# Patient Record
Sex: Female | Born: 1955
Health system: Southern US, Community
[De-identification: ages and names within clinical notes are randomized; demographics above are authoritative.]

## PROBLEM LIST (undated history)

## (undated) DIAGNOSIS — E785 Hyperlipidemia, unspecified: Secondary | ICD-10-CM

## (undated) DIAGNOSIS — F411 Generalized anxiety disorder: Secondary | ICD-10-CM

## (undated) DIAGNOSIS — K219 Gastro-esophageal reflux disease without esophagitis: Secondary | ICD-10-CM

## (undated) DIAGNOSIS — T7840XA Allergy, unspecified, initial encounter: Secondary | ICD-10-CM

## (undated) HISTORY — PX: GANGLION CYST EXCISION: SHX1691

## (undated) HISTORY — DX: Generalized anxiety disorder: F41.1

## (undated) HISTORY — PX: BREAST SURGERY: SHX581

## (undated) HISTORY — DX: Allergy, unspecified, initial encounter: T78.40XA

## (undated) HISTORY — DX: Hyperlipidemia, unspecified: E78.5

## (undated) HISTORY — DX: Gastro-esophageal reflux disease without esophagitis: K21.9

---

## 1997-10-18 ENCOUNTER — Other Ambulatory Visit: Admission: RE | Admit: 1997-10-18 | Discharge: 1997-10-18 | Payer: Self-pay | Admitting: *Deleted

## 1999-01-25 ENCOUNTER — Other Ambulatory Visit: Admission: RE | Admit: 1999-01-25 | Discharge: 1999-01-25 | Payer: Self-pay | Admitting: Obstetrics and Gynecology

## 2000-04-08 ENCOUNTER — Other Ambulatory Visit: Admission: RE | Admit: 2000-04-08 | Discharge: 2000-04-08 | Payer: Self-pay | Admitting: *Deleted

## 2001-05-05 ENCOUNTER — Other Ambulatory Visit: Admission: RE | Admit: 2001-05-05 | Discharge: 2001-05-05 | Payer: Self-pay | Admitting: Obstetrics and Gynecology

## 2002-06-20 ENCOUNTER — Other Ambulatory Visit: Admission: RE | Admit: 2002-06-20 | Discharge: 2002-06-20 | Payer: Self-pay | Admitting: Obstetrics and Gynecology

## 2002-09-12 ENCOUNTER — Other Ambulatory Visit: Admission: RE | Admit: 2002-09-12 | Discharge: 2002-09-12 | Payer: Self-pay | Admitting: Family Medicine

## 2003-08-30 ENCOUNTER — Other Ambulatory Visit: Admission: RE | Admit: 2003-08-30 | Discharge: 2003-08-30 | Payer: Self-pay | Admitting: Obstetrics and Gynecology

## 2003-11-17 LAB — HM PAP SMEAR: HM Pap smear: NORMAL

## 2005-06-03 ENCOUNTER — Ambulatory Visit (HOSPITAL_COMMUNITY): Admission: RE | Admit: 2005-06-03 | Discharge: 2005-06-03 | Payer: Self-pay | Admitting: Obstetrics and Gynecology

## 2005-07-11 ENCOUNTER — Encounter: Admission: RE | Admit: 2005-07-11 | Discharge: 2005-07-11 | Payer: Self-pay | Admitting: Obstetrics and Gynecology

## 2006-07-09 ENCOUNTER — Ambulatory Visit (HOSPITAL_COMMUNITY): Admission: RE | Admit: 2006-07-09 | Discharge: 2006-07-09 | Payer: Self-pay | Admitting: Obstetrics and Gynecology

## 2007-07-12 ENCOUNTER — Ambulatory Visit (HOSPITAL_COMMUNITY): Admission: RE | Admit: 2007-07-12 | Discharge: 2007-07-12 | Payer: Self-pay | Admitting: Obstetrics and Gynecology

## 2010-06-17 ENCOUNTER — Ambulatory Visit (HOSPITAL_COMMUNITY)
Admission: RE | Admit: 2010-06-17 | Discharge: 2010-06-17 | Payer: Self-pay | Source: Home / Self Care | Admitting: Obstetrics and Gynecology

## 2012-12-17 ENCOUNTER — Other Ambulatory Visit (HOSPITAL_COMMUNITY): Payer: Self-pay | Admitting: Obstetrics and Gynecology

## 2012-12-17 DIAGNOSIS — Z1231 Encounter for screening mammogram for malignant neoplasm of breast: Secondary | ICD-10-CM

## 2012-12-24 ENCOUNTER — Ambulatory Visit (HOSPITAL_COMMUNITY)
Admission: RE | Admit: 2012-12-24 | Discharge: 2012-12-24 | Disposition: A | Discharge status: Home / Self Care | Payer: BC Managed Care – PPO | Source: Ambulatory Visit | Attending: Obstetrics and Gynecology | Admitting: Obstetrics and Gynecology

## 2012-12-24 DIAGNOSIS — Z1231 Encounter for screening mammogram for malignant neoplasm of breast: Secondary | ICD-10-CM | POA: Insufficient documentation

## 2013-01-04 ENCOUNTER — Other Ambulatory Visit: Payer: Self-pay | Admitting: Gynecology

## 2013-01-04 ENCOUNTER — Other Ambulatory Visit: Payer: Self-pay | Admitting: Obstetrics and Gynecology

## 2013-01-04 DIAGNOSIS — R928 Other abnormal and inconclusive findings on diagnostic imaging of breast: Secondary | ICD-10-CM

## 2013-01-12 ENCOUNTER — Other Ambulatory Visit: Payer: Self-pay | Admitting: Family Medicine

## 2013-01-12 DIAGNOSIS — R928 Other abnormal and inconclusive findings on diagnostic imaging of breast: Secondary | ICD-10-CM

## 2013-01-18 ENCOUNTER — Ambulatory Visit
Admission: RE | Admit: 2013-01-18 | Discharge: 2013-01-18 | Disposition: A | Discharge status: Home / Self Care | Payer: BC Managed Care – PPO | Source: Ambulatory Visit | Attending: Obstetrics and Gynecology | Admitting: Obstetrics and Gynecology

## 2013-01-18 DIAGNOSIS — R928 Other abnormal and inconclusive findings on diagnostic imaging of breast: Secondary | ICD-10-CM

## 2013-01-28 ENCOUNTER — Encounter: Payer: Self-pay | Admitting: Family Medicine

## 2013-02-09 ENCOUNTER — Ambulatory Visit (INDEPENDENT_AMBULATORY_CARE_PROVIDER_SITE_OTHER): Payer: BC Managed Care – PPO | Admitting: Physician Assistant

## 2013-02-09 ENCOUNTER — Encounter: Payer: Self-pay | Admitting: Physician Assistant

## 2013-02-09 VITALS — BP 132/80 | HR 60 | Temp 98.2°F | Resp 18 | Ht 65.5 in | Wt 212.0 lb

## 2013-02-09 DIAGNOSIS — Z1211 Encounter for screening for malignant neoplasm of colon: Secondary | ICD-10-CM

## 2013-02-09 DIAGNOSIS — Z0000 Encounter for general adult medical examination without abnormal findings: Secondary | ICD-10-CM

## 2013-02-09 DIAGNOSIS — F411 Generalized anxiety disorder: Secondary | ICD-10-CM

## 2013-02-09 HISTORY — DX: Generalized anxiety disorder: F41.1

## 2013-02-09 LAB — COMPLETE METABOLIC PANEL WITH GFR
ALT: 19 U/L (ref 0–35)
AST: 22 U/L (ref 0–37)
Albumin: 4.5 g/dL (ref 3.5–5.2)
Alkaline Phosphatase: 87 U/L (ref 39–117)
BUN: 13 mg/dL (ref 6–23)
CO2: 24 mEq/L (ref 19–32)
Calcium: 9.6 mg/dL (ref 8.4–10.5)
Chloride: 105 mEq/L (ref 96–112)
Creat: 0.83 mg/dL (ref 0.50–1.10)
GFR, Est African American: >89 mL/min
GFR, Est Non African American: 79 mL/min
Glucose, Bld: 106 mg/dL — ABNORMAL HIGH (ref 70–99)
Sodium: 140 mEq/L (ref 135–145)
Total Bilirubin: 0.4 mg/dL (ref 0.3–1.2)
Total Protein: 7.1 g/dL (ref 6.0–8.3)

## 2013-02-09 LAB — TSH: TSH: 0.837 u[IU]/mL (ref 0.350–4.500)

## 2013-02-09 LAB — CBC WITH DIFFERENTIAL/PLATELET
Basophils Relative: 1 % (ref 0–1)
Eosinophils Absolute: 0.1 10*3/uL (ref 0.0–0.7)
Eosinophils Relative: 2 % (ref 0–5)
HCT: 41.7 % (ref 36.0–46.0)
Hemoglobin: 14.2 g/dL (ref 12.0–15.0)
Lymphs Abs: 2.9 10*3/uL (ref 0.7–4.0)
MCH: 29.6 pg (ref 26.0–34.0)
MCHC: 34.1 g/dL (ref 30.0–36.0)
MCV: 87.1 fL (ref 78.0–100.0)
Monocytes Absolute: 0.4 10*3/uL (ref 0.1–1.0)
Monocytes Relative: 6 % (ref 3–12)
Neutro Abs: 2.3 10*3/uL (ref 1.7–7.7)
Neutrophils Relative %: 40 % — ABNORMAL LOW (ref 43–77)
Platelets: 398 10*3/uL (ref 150–400)
RBC: 4.79 MIL/uL (ref 3.87–5.11)
RDW: 12.1 % (ref 11.5–15.5)
WBC: 5.8 10*3/uL (ref 4.0–10.5)

## 2013-02-09 MED ORDER — CITALOPRAM HYDROBROMIDE 20 MG PO TABS: 20.0000 mg | ORAL_TABLET | Freq: Every day | ORAL | Status: DC

## 2013-02-09 MED ORDER — CLONAZEPAM 0.5 MG PO TABS: 0.5000 mg | ORAL_TABLET | Freq: Two times a day (BID) | ORAL | Status: DC | PRN

## 2013-02-09 NOTE — Progress Notes (Signed)
Patient ID: Kristi Sims MRN: [id number], DOB: 10-Oct-1955, 57 y.o. Date of Encounter: @DATE @  Chief Complaint:  Chief Complaint  Patient presents with  . Annual Exam    no pap    HPI: 57 y.o. year old white female  presents for CPE. Her LOV here was in 2011 and that was just for a abscess. Has never had CPE here-only OVs for specific problems. Also, she states she has had no medical care elsewhere since LOV here.   Reports feeling high stress and anxiety-both at work and at home. Works 10 hour shifts.Works 56 hour/week. At [employer]. (see social hx section for details).  Lives alone0has lived alone for long time. But, feels anxious all the time, even at home. There is no true high stress in her life comapred to usual.   Had some lab and BP check at work recently-brought those results with her.   No other c/o.   Past Medical History  Diagnosis Date  . Generalized anxiety disorder 02/09/2013     Home Meds: See attached medication section for current medication list. Any medications entered into computer today will not appear on this note's list. The medications listed below were entered prior to today. No current outpatient prescriptions on file prior to visit.   No current facility-administered medications on file prior to visit.    Allergies: No Known Allergies  History   Social History  . Marital Status: Single    Spouse Name: N/A    Number of Children: N/A  . Years of Education: N/A   Occupational History  . Factory Work    Social History Main Topics  . Smoking status: Former Smoker    Types: Cigarettes    Quit date: 11/10/2012  . Smokeless tobacco: Never Used  . Alcohol Use: No  . Drug Use: No  . Sexually Active: No   Other Topics Concern  . 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    "Tries to live [place] Life-Not Sexually  Active by Choice"    Family History  Problem Relation Age of Onset  . Cancer Mother     cervical s/p hyst  . Hypertension Father   . Diabetes Brother      Review of Systems:  See HPI for pertinent ROS. All other ROS negative.    Physical Exam: Blood pressure 132/80, pulse 60, temperature 98.2 F (36.8 C), temperature source Oral, resp. rate 18, height 5' 5.5" (1.664 m), weight 212 lb (96.163 kg)., Body mass index is 34.73 kg/(m^2). General: WNWD AAF. Appears in no acute distress. Head: Normocephalic, atraumatic, eyes without discharge, sclera non-icteric, nares are without discharge. Bilateral auditory canals obstructed by cerumen. Oral cavity moist, posterior pharynx without exudate, erythema, peritonsillar abscess, or post nasal drip. Neck: Supple. No thyromegaly. No lymphadenopathy.No carotid bruits. Lungs: Clear bilaterally to auscultation without wheezes, rales, or rhonchi. Breathing is unlabored. Heart: RRR with S1 S2. No murmurs, rubs, or gallops. Abdomen: Soft, non-tender, non-distended with normoactive bowel sounds. No hepatomegaly. No rebound/guarding. No obvious abdominal masses. Musculoskeletal:  Strength and tone normal for age. Extremities/Skin: Warm and dry. No edema. No rashes or suspicious lesions. Neuro: Alert and oriented X 3. Moves all extremities spontaneously. Gait is normal. CNII-XII grossly in tact. Psych:  Responds to questions appropriately with a normal affect.     ASSESSMENT  AND PLAN:  57 y.o. year old female with  1. Visit for preventive health examination  A.Screening Labs: Pt had some labs done through her employer. She brought that print out with her. 11/02/12: Glucose: 74  Chol:  210  Trig:   45  HDL:   87  LDL:   114 - CBC with Differential - COMPLETE METABOLIC PANEL WITH GFR - TSH - Vitamin D 25 hydroxy  B. Mammogram and Left Breast U/S: 01/18/2013-Normal.Repeat one year.  C. Breast Exam, Pelvic Exam/Pap: Cont annual eval with  Gyn  Colonoscopy: Has never had. Is agreeable to have. I will refer.  Immunizations: Had Tetanus at work in past 10 years.   No immuni needed now.   2. Generalized anxiety disorder Discussed proper use and expectation of each medication at length. Do not take Klonopin prior to driving, operating machinery, or going to work. If has adv effect to med, call me. Ow/ f/u OV 6 weeks.  Reviewed chart. I prescribed Celexa back in 209. It worked well for her and caused no adv effects so will use this again.  - citalopram (CELEXA) 20 MG tablet; Take 1 tablet (20 mg total) by mouth daily.  Dispense: 30 tablet; Refill: 3 - clonazePAM (KLONOPIN) 0.5 MG tablet; Take 1 tablet (0.5 mg total) by mouth 2 (two) times daily as needed for anxiety.  Dispense: 20 tablet; Refill: 1  3. Screening for colorectal cancer - Ambulatory referral to Gastroenterology  Cerumen Impaction, Bilaterally-offered for [place] to irrigate. She prefers to use otc drops.    ROV 6 weeks, sooner if needed.  Signed, [address] [place], [place], [facility] 56/30/2014 1:33 PM

## 2013-02-10 ENCOUNTER — Encounter: Payer: Self-pay | Admitting: Family Medicine

## 2013-04-06 ENCOUNTER — Encounter: Payer: Self-pay | Admitting: Family Medicine

## 2013-05-23 ENCOUNTER — Telehealth: Payer: Self-pay | Admitting: *Deleted

## 2013-05-23 DIAGNOSIS — F411 Generalized anxiety disorder: Secondary | ICD-10-CM

## 2013-05-23 MED ORDER — CLONAZEPAM 0.5 MG PO TABS: 0.5000 mg | ORAL_TABLET | Freq: Two times a day (BID) | ORAL | Status: DC | PRN

## 2013-05-23 MED ORDER — CITALOPRAM HYDROBROMIDE 20 MG PO TABS: 20.0000 mg | ORAL_TABLET | Freq: Every day | ORAL | Status: DC

## 2013-05-23 NOTE — Telephone Encounter (Signed)
ok 

## 2013-05-23 NOTE — Telephone Encounter (Signed)
?   OK to Refill  

## 2013-05-23 NOTE — Telephone Encounter (Signed)
Rx Refilled  

## 2013-06-01 ENCOUNTER — Other Ambulatory Visit: Payer: Self-pay | Admitting: Physician Assistant

## 2013-07-03 ENCOUNTER — Other Ambulatory Visit: Payer: Self-pay | Admitting: Physician Assistant

## 2013-08-18 ENCOUNTER — Other Ambulatory Visit (HOSPITAL_COMMUNITY): Payer: Self-pay | Admitting: Obstetrics and Gynecology

## 2013-08-18 DIAGNOSIS — N951 Menopausal and female climacteric states: Secondary | ICD-10-CM

## 2013-09-08 ENCOUNTER — Other Ambulatory Visit: Payer: Self-pay | Admitting: Family Medicine

## 2013-09-09 ENCOUNTER — Encounter: Payer: Self-pay | Admitting: Family Medicine

## 2013-09-09 NOTE — Telephone Encounter (Signed)
Will send letter to schedule ov

## 2013-09-26 ENCOUNTER — Other Ambulatory Visit: Payer: Self-pay | Admitting: Family Medicine

## 2013-09-26 DIAGNOSIS — F419 Anxiety disorder, unspecified: Secondary | ICD-10-CM

## 2013-09-27 ENCOUNTER — Encounter: Payer: Self-pay | Admitting: Family Medicine

## 2013-09-27 NOTE — Telephone Encounter (Signed)
Last RF 05/23/13  #20 +1.  Last ov 02/10/16.  Was to return in 6 weeks !!  NTBS.  Letter sent to schedule appt. Refill denied

## 2013-10-13 ENCOUNTER — Ambulatory Visit (INDEPENDENT_AMBULATORY_CARE_PROVIDER_SITE_OTHER): Payer: BC Managed Care – PPO | Admitting: Physician Assistant

## 2013-10-13 ENCOUNTER — Ambulatory Visit: Payer: BC Managed Care – PPO | Admitting: Physician Assistant

## 2013-10-13 ENCOUNTER — Encounter: Payer: Self-pay | Admitting: Physician Assistant

## 2013-10-13 VITALS — BP 130/80 | HR 68 | Temp 98.1°F | Resp 18 | Ht 64.5 in | Wt 217.0 lb

## 2013-10-13 DIAGNOSIS — H6120 Impacted cerumen, unspecified ear: Secondary | ICD-10-CM

## 2013-10-13 DIAGNOSIS — Z1212 Encounter for screening for malignant neoplasm of rectum: Principal | ICD-10-CM

## 2013-10-13 DIAGNOSIS — Z1211 Encounter for screening for malignant neoplasm of colon: Secondary | ICD-10-CM

## 2013-10-13 DIAGNOSIS — E669 Obesity, unspecified: Secondary | ICD-10-CM

## 2013-10-13 DIAGNOSIS — F411 Generalized anxiety disorder: Secondary | ICD-10-CM

## 2013-10-13 MED ORDER — CITALOPRAM HYDROBROMIDE 20 MG PO TABS: 20.0000 mg | ORAL_TABLET | Freq: Every day | ORAL | Status: DC

## 2013-10-13 MED ORDER — CLONAZEPAM 0.5 MG PO TABS: 0.5000 mg | ORAL_TABLET | Freq: Two times a day (BID) | ORAL | Status: DC | PRN

## 2013-10-13 NOTE — Progress Notes (Signed)
Patient ID: Kristi Sims MRN: [id number], DOB: 1955/09/01, 58 y.o. Date of Encounter: @DATE @  Chief Complaint:  Chief Complaint  Patient presents with  . 6 month check up    left ear is bothering her, not fasting, med refills    HPI: 58 y.o. year old female  presents for routine followup office visit.  Saw her for complete physical exam on 02/09/13.  At that visit 02/09/13 she also reported feeling high stress and anxiety--both at work and at home. Reported that she works 10 hours shifts. Works 56 hours per week. Works at [employer]. She lives alone and has lived alone for a long time. However she reported that she felt anxious all the time even at home. She reported that there was no true high stress in her life compared to usual at that time but still would feel anxious and tense all the time.  At that visit I reviewed that I had prescribed her Celexa back in 2009. It worked well for her and caused no adverse effects back then, so I  prescribed this again 01/2013.  Today she reports that she is taking the Celexa 20 mg daily. Says that it is working great. Says that she does not feel nearly as anxious and tense all the time. Says that in general she " just feels better."         Says some nights she uses the Klonopin but does not take it every night. Rarely needs her Klonopin during the day.  I noted that I had referred her for colonoscopy at her last visit but that this has not been done. She reports that she was having problems with her car and did not have transportation at that time. She says that she now does have her car and does have transportation available and is agreeable to go for this.    Past Medical History  Diagnosis Date  . Generalized anxiety disorder 02/09/2013     Home Meds: See attached medication section for current medication list. Any medications entered into computer today will not appear on this note's list. The medications listed below were entered prior  to today. No current outpatient prescriptions on file prior to visit.   No current facility-administered medications on file prior to visit.    Allergies: No Known Allergies  History   Social History  . Marital Status: Single    Spouse Name: N/A    Number of Children: N/A  . Years of Education: N/A   Occupational History  . Factory Work    Social History Main Topics  . Smoking status: Former Smoker    Types: Cigarettes    Quit date: 11/10/2012  . Smokeless tobacco: Never Used  . Alcohol Use: No  . Drug Use: No  . Sexual Activity: No   Other Topics Concern  . 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    "Tries to live [place] Sexually Active by Choice"    Family History  Problem Relation Age of Onset  . Cancer Mother     cervical s/p hyst  . Hypertension Father   . Diabetes Brother      Review of Systems:  See HPI for pertinent ROS. All other ROS negative.    Physical Exam: Blood pressure 130/80, pulse 68, temperature 98.1 F (  36.7 C), temperature source Oral, resp. rate 18, height 5' 4.5" (1.638 m), weight 217 lb (98.431 kg)., Body mass index is 36.69 kg/(m^2). General: Obese Female. Appears in no acute distress. Head: Bilateral ear canals are completely obstructed with cerumen. Neck: Supple. No thyromegaly. No lymphadenopathy. No carotid bruits. Lungs: Clear bilaterally to auscultation without wheezes, rales, or rhonchi. Breathing is unlabored. Heart: RRR with S1 S2. No murmurs, rubs, or gallops. Abdomen: Soft, non-tender, non-distended with normoactive bowel sounds. No hepatomegaly. No rebound/guarding. No obvious abdominal masses. Musculoskeletal:  Strength and tone normal for age. Extremities/Skin: Warm and dry. No clubbing or cyanosis. No edema. No rashes or suspicious lesions. Neuro: Alert and oriented X 3. Moves all  extremities spontaneously. Gait is normal. CNII-XII grossly in tact. Psych:  Responds to questions appropriately with a normal affect.     ASSESSMENT AND PLAN:  58 y.o. year old female with  1. Generalized anxiety disorder - clonazePAM (KLONOPIN) 0.5 MG tablet; Take 1 tablet (0.5 mg total) by mouth 2 (two) times daily as needed for anxiety.  Dispense: 30 tablet; Refill: 1 - citalopram (CELEXA) 20 MG tablet; Take 1 tablet (20 mg total) by mouth daily.  Dispense: 30 tablet; Refill: 5  2. Screening for colorectal cancer - Ambulatory referral to Gastroenterology  3. Cerumen impaction He had bilateral cerumen impaction at her visit 02/09/13. At that time she opted to use over-the-counter drops. She says that she has been using these. However still with impaction so we will irrigate her ears today.  FOR PREVENTIVE CARE SEE CPE NOTE 02/09/2013. ALSO, UPDATE INFO GIVEN TO SABRINA TODAY TO ABSTRACT FOR CINA    Signed, [address] [place], [place], [facility] 10/13/2013 10:12 AM

## 2013-11-14 ENCOUNTER — Encounter: Payer: Self-pay | Admitting: Physician Assistant

## 2014-03-01 ENCOUNTER — Other Ambulatory Visit: Payer: Self-pay | Admitting: Physician Assistant

## 2014-03-02 NOTE — Telephone Encounter (Signed)
Ok to refill??  Last office visit/ refill 10/13/2013, #1 refill.

## 2014-03-02 NOTE — Telephone Encounter (Signed)
ok 

## 2014-03-02 NOTE — Telephone Encounter (Signed)
Medication called to pharmacy. 

## 2014-04-14 ENCOUNTER — Telehealth: Payer: Self-pay | Admitting: Family Medicine

## 2014-04-14 ENCOUNTER — Encounter: Payer: Self-pay | Admitting: Family Medicine

## 2014-04-14 DIAGNOSIS — F411 Generalized anxiety disorder: Secondary | ICD-10-CM

## 2014-04-14 MED ORDER — CITALOPRAM HYDROBROMIDE 20 MG PO TABS: 20.0000 mg | ORAL_TABLET | Freq: Every day | ORAL | Status: DC

## 2014-04-14 NOTE — Telephone Encounter (Signed)
Medication refill for one time only.  Patient needs to be seen.  Letter sent for patient to call and schedule 

## 2014-05-13 ENCOUNTER — Other Ambulatory Visit: Payer: Self-pay | Admitting: Physician Assistant

## 2014-05-15 ENCOUNTER — Ambulatory Visit: Payer: BC Managed Care – PPO | Admitting: Physician Assistant

## 2014-05-15 NOTE — Telephone Encounter (Signed)
Medication refilled per protocol. 

## 2014-05-31 ENCOUNTER — Ambulatory Visit (INDEPENDENT_AMBULATORY_CARE_PROVIDER_SITE_OTHER): Payer: BC Managed Care – PPO | Admitting: Physician Assistant

## 2014-05-31 ENCOUNTER — Encounter: Payer: Self-pay | Admitting: Physician Assistant

## 2014-05-31 VITALS — BP 146/84 | HR 56 | Temp 98.2°F | Resp 18 | Wt 212.0 lb

## 2014-05-31 DIAGNOSIS — F411 Generalized anxiety disorder: Secondary | ICD-10-CM

## 2014-05-31 DIAGNOSIS — E669 Obesity, unspecified: Secondary | ICD-10-CM

## 2014-05-31 DIAGNOSIS — H6123 Impacted cerumen, bilateral: Secondary | ICD-10-CM

## 2014-05-31 DIAGNOSIS — Z1212 Encounter for screening for malignant neoplasm of rectum: Secondary | ICD-10-CM

## 2014-05-31 DIAGNOSIS — Z1211 Encounter for screening for malignant neoplasm of colon: Secondary | ICD-10-CM

## 2014-05-31 MED ORDER — CLONAZEPAM 0.5 MG PO TABS: ORAL_TABLET | ORAL | Status: DC

## 2014-05-31 MED ORDER — CITALOPRAM HYDROBROMIDE 20 MG PO TABS: ORAL_TABLET | ORAL | Status: DC

## 2014-05-31 NOTE — Progress Notes (Signed)
Patient ID: Kristi Sims MRN: [id number], DOB: 1955-08-07, 58 y.o. Date of Encounter: @DATE @  Chief Complaint:  Chief Complaint  Patient presents with  . 6 month check up    not fasting, left ear ache    HPI: 58 y.o. year old female  presents for routine followup office visit.  Saw her for complete physical exam on 02/09/13.  At that visit 02/09/13 she also reported feeling high stress and anxiety--both at work and at home. Reported that she works 10 hours shifts. Works 56 hours per week. Works at [employer]. She lives alone and has lived alone for a long time. However she reported that she felt anxious all the time even at home. She reported that there was no true high stress in her life compared to usual at that time but still would feel anxious and tense all the time.  At that visit I reviewed that I had prescribed her Celexa back in 2009. It worked well for her and caused no adverse effects back then, so I  prescribed this again 01/2013.  At [place] 10/2013 she reported that she was taking the Celexa 20 mg daily. Michela Pitcher that it is working great. Said that she does not feel nearly as anxious and tense all the time. Said that in general she " just feels better."  Said some nights she uses the Klonopin but does not take it every night. Rarely needs her Klonopin during the day.  Today--05/2014--she says that the Celexa is still working very well. Says that she does not feel stressed out. Says that she can walk more and therefore make more money. Says that she rarely uses Klonopin during the day. Says that she uses that some nights but only about 1 night per week on average.  Says that business has been "picked up "and so she's been working 7 days per week because she needed the money. Says because of this work schedule she has not gone for colonoscopy which was discussed at the last couple of visits.  She feels good and has no complaints today.   Past Medical History  Diagnosis Date  .  Generalized anxiety disorder 02/09/2013     Home Meds: . Current Outpatient Prescriptions on File Prior to Visit  Medication Sig Dispense Refill  . citalopram (CELEXA) 20 MG tablet TAKE 1 TABLET EVERY DAY....NEED OFFICE VISIT BEFORE ANY FUTHER REFILLS 30 tablet 0  . clonazePAM (KLONOPIN) 0.5 MG tablet TAKE 1 TABLET BY MOUTH 2 TIMES DAILY AS NEEDED FOR ANXIETY 30 tablet 1   No current facility-administered medications on file prior to visit.    Allergies: No Known Allergies  History   Social History  . Marital Status: Single    Spouse Name: N/A    Number of Children: N/A  . Years of Education: N/A   Occupational History  . Factory Work    Social History Main Topics  . Smoking status: Former Smoker    Types: Cigarettes    Quit date: 11/10/2012  . Smokeless tobacco: Never Used  . Alcohol Use: No  . Drug Use: No  . Sexual Activity: No   Other Topics Concern  . 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    "Tries to live [place] Sexually Active by Choice"    Family History  Problem  Relation Age of Onset  . Cancer Mother     cervical s/p hyst  . Hypertension Father   . Diabetes Brother      Review of Systems:  See HPI for pertinent ROS. All other ROS negative.    Physical Exam: Blood pressure 146/84, pulse 56, temperature 98.2 F (36.8 C), temperature source Oral, resp. rate 18, weight 212 lb (96.163 kg)., Body mass index is 35.84 kg/(m^2). General: Obese Female. Appears in no acute distress. Head: Bilateral ear canals are completely obstructed with cerumen. Neck: Supple. No thyromegaly. No lymphadenopathy. No carotid bruits. Lungs: Clear bilaterally to auscultation without wheezes, rales, or rhonchi. Breathing is unlabored. Heart: RRR with S1 S2. No murmurs, rubs, or gallops. Abdomen: Soft, non-tender, non-distended with  normoactive bowel sounds. No hepatomegaly. No rebound/guarding. No obvious abdominal masses. Musculoskeletal:  Strength and tone normal for age. Extremities/Skin: Warm and dry.  No edema.  Neuro: Alert and oriented X 3. Moves all extremities spontaneously. Gait is normal. CNII-XII grossly in tact. Psych:  Responds to questions appropriately with a normal affect.     ASSESSMENT AND PLAN:  58 y.o. year old female with   1. Generalized anxiety disorder - citalopram (CELEXA) 20 MG tablet; TAKE 1 TABLET EVERY DAY....NEED OFFICE VISIT BEFORE ANY FUTHER REFILLS  Dispense: 90 tablet; Refill: 1 - clonazePAM (KLONOPIN) 0.5 MG tablet; TAKE 1 TABLET BY MOUTH 2 TIMES DAILY AS NEEDED FOR ANXIETY  Dispense: 30 tablet; Refill: 1  2. Screening for colorectal cancer See HPI--she will call me when she can take a day off work.  3. Obesity   4. Cerumen impaction, bilateral Irrigated both of her ears at her last visit 10/13/13. She wants [place] to go ahead and irrigate both ears again today.--she has minimal cerumen only around the periphery of the canal on the right. However she does have complete occlusion of the canal with cerumen on the left. At her visit 02/09/13 she had cerumen bilaterally and at that visit she had opted to use over-the-counter drops but when she came in for follow-up those had not worked.    FOR PREVENTIVE CARE SEE THE FOLLOWING, COPIED FROM CPE NOTE 02/09/2013:  THE FOLLOWING IS COPIED FROM CPE NOTE 02/09/2013:  1. Visit for preventive health examination  A.Screening Labs: Pt had some labs done through her employer. She brought that print out with her. 11/02/12: Glucose: 74 Chol: 210 Trig: 45 HDL: 87 LDL: 114 - CBC with Differential - COMPLETE METABOLIC PANEL WITH GFR - TSH - Vitamin D 25 hydroxy  B. Mammogram and Left Breast U/S: 01/18/2013-Normal.Repeat one year.  C. Breast Exam, Pelvic Exam/Pap: Cont annual eval with  Gyn  Colonoscopy: Has never had. Is agreeable to have. I will refer.  Immunizations: Had Tetanus at work in past 10 years.  No immuni needed now.   Signed, [address] [place], [place], [facility] 05/31/2014 8:14 AM

## 2014-10-30 ENCOUNTER — Other Ambulatory Visit: Payer: Self-pay | Admitting: Physician Assistant

## 2014-10-31 NOTE — Telephone Encounter (Signed)
rx called in

## 2014-10-31 NOTE — Telephone Encounter (Signed)
ok 

## 2014-10-31 NOTE — Telephone Encounter (Signed)
Ok to refill??  Last office visit/ refill 05/31/2014, #1 refill.

## 2014-12-14 ENCOUNTER — Other Ambulatory Visit: Payer: Self-pay | Admitting: Physician Assistant

## 2015-03-30 ENCOUNTER — Other Ambulatory Visit: Payer: Self-pay | Admitting: Family Medicine

## 2015-04-02 NOTE — Telephone Encounter (Signed)
Medication called to pharmacy. 

## 2015-04-02 NOTE — Telephone Encounter (Signed)
ok 

## 2015-04-02 NOTE — Telephone Encounter (Signed)
Ok to refill??  Last office visit 05/31/2014.  Last refill 10/31/2014, #1 refill.

## 2015-06-28 ENCOUNTER — Encounter: Payer: Self-pay | Admitting: Family Medicine

## 2015-06-28 ENCOUNTER — Other Ambulatory Visit: Payer: Self-pay | Admitting: Physician Assistant

## 2015-06-28 NOTE — Telephone Encounter (Signed)
Medication refill for one time only.  Patient needs to be seen.  Letter sent for patient to call and schedule.  Pt has not been seen in over 1 year. 

## 2015-08-08 ENCOUNTER — Other Ambulatory Visit: Payer: Self-pay | Admitting: Physician Assistant

## 2015-08-30 ENCOUNTER — Encounter: Payer: Self-pay | Admitting: Podiatry

## 2015-08-30 ENCOUNTER — Ambulatory Visit (INDEPENDENT_AMBULATORY_CARE_PROVIDER_SITE_OTHER): Payer: BLUE CROSS/BLUE SHIELD | Admitting: Podiatry

## 2015-08-30 VITALS — BP 123/70 | HR 62 | Resp 12

## 2015-08-30 DIAGNOSIS — L600 Ingrowing nail: Secondary | ICD-10-CM | POA: Diagnosis not present

## 2015-08-30 NOTE — Progress Notes (Signed)
Subjective:     Patient ID: KHRYSTEN Sims, female   DOB: 09/16/1955, 60 y.o.   MRN: [id number]  HPI patient states this left big toenail is bothering me more and more and I know I need to have it removed as I had a removed temporarily before and it seems to be bothering me increasingly   Review of Systems  All other systems reviewed and are negative.      Objective:   Physical Exam  Constitutional: She is oriented to person, place, and time.  Cardiovascular: Intact distal pulses.   Musculoskeletal: Normal range of motion.  Neurological: She is oriented to person, place, and time.  Skin: Skin is warm.  Nursing note and vitals reviewed.  neurovascular status intact muscle strength adequate range of motion within normal limits with patient found to have a thickened damaged left hallux nail that's loose and painful when pressed from a dorsal direction. Good digital perfusion and well oriented 3     Assessment:     Damaged left hallux nail with deformity of the bed and pain    Plan:     H&P condition reviewed and reviewed treatment options. Due to long-standing nature I recommended nail removal and a permanent fashion patient wanting procedure understanding risk. Today I infiltrated the left hallux 60 mg like Marcaine mixture remove the hallux nail exposed matrix and applied phenol 5 applications 30 seconds followed by alcohol lavage and sterile dressing. Gave instructions on soaks and reappoint

## 2015-08-30 NOTE — Patient Instructions (Signed)

## 2015-08-30 NOTE — Progress Notes (Signed)
   Subjective:    Patient ID: Kristi Sims, female    DOB: 1955-11-18, 60 y.o.   MRN: [id number]  HPI  Lt foot great toenail is sore/discoloration/split in the middle for 2 months. Toenail is getting worse especially when putting pressure. Tried trim it down-no treatment.  Review of Systems  Skin: Positive for color change.       Objective:   Physical Exam        Assessment & Plan:

## 2015-09-07 ENCOUNTER — Telehealth: Payer: Self-pay | Admitting: *Deleted

## 2015-09-07 NOTE — Telephone Encounter (Signed)
Left message for patient at [phone] (Home #) to check to see how they were doing from their ingrown toenail procedure that was performed on Thursday, August 30, 2015. Waiting for a response.

## 2015-09-15 ENCOUNTER — Other Ambulatory Visit: Payer: Self-pay | Admitting: Physician Assistant

## 2015-09-17 NOTE — Telephone Encounter (Signed)
Refill denied pt has not been seen in over 1 year.  Letter was sent in December to make appt.

## 2016-03-26 ENCOUNTER — Ambulatory Visit (INDEPENDENT_AMBULATORY_CARE_PROVIDER_SITE_OTHER): Payer: BLUE CROSS/BLUE SHIELD | Admitting: Physician Assistant

## 2016-03-26 ENCOUNTER — Encounter: Payer: Self-pay | Admitting: Physician Assistant

## 2016-03-26 VITALS — BP 124/70 | HR 56 | Temp 97.8°F | Resp 16 | Wt 216.0 lb

## 2016-03-26 DIAGNOSIS — F17200 Nicotine dependence, unspecified, uncomplicated: Secondary | ICD-10-CM

## 2016-03-26 DIAGNOSIS — Z720 Tobacco use: Secondary | ICD-10-CM | POA: Diagnosis not present

## 2016-03-26 DIAGNOSIS — B9689 Other specified bacterial agents as the cause of diseases classified elsewhere: Principal | ICD-10-CM

## 2016-03-26 DIAGNOSIS — J988 Other specified respiratory disorders: Secondary | ICD-10-CM

## 2016-03-26 DIAGNOSIS — F411 Generalized anxiety disorder: Secondary | ICD-10-CM | POA: Diagnosis not present

## 2016-03-26 MED ORDER — CITALOPRAM HYDROBROMIDE 20 MG PO TABS: 20.0000 mg | ORAL_TABLET | Freq: Every day | ORAL | 5 refills | Status: DC

## 2016-03-26 MED ORDER — AZITHROMYCIN 250 MG PO TABS: ORAL_TABLET | ORAL | 0 refills | Status: DC

## 2016-03-26 MED ORDER — CLONAZEPAM 0.5 MG PO TABS: 0.5000 mg | ORAL_TABLET | Freq: Every day | ORAL | 1 refills | Status: DC | PRN

## 2016-03-26 MED ORDER — PREDNISONE 20 MG PO TABS: 20.0000 mg | ORAL_TABLET | Freq: Every day | ORAL | 0 refills | Status: DC

## 2016-03-26 MED ORDER — ALBUTEROL SULFATE HFA 108 (90 BASE) MCG/ACT IN AERS: 2.0000 | INHALATION_SPRAY | Freq: Four times a day (QID) | RESPIRATORY_TRACT | 0 refills | Status: DC | PRN

## 2016-03-26 NOTE — Progress Notes (Signed)
Patient ID: Kristi Sims MRN: [id number], DOB: 1956-05-13, 60 y.o. Date of Encounter: @DATE @  Chief Complaint:  Chief Complaint  Patient presents with  . URI    HPI: 60 y.o. year old female  presents with above.   Says that these.symptoms started several weeks ago. She has used several different otc medications and kept thinking that it would improve so didn't come in, but finally realized she needed to come. Says in the beginning she had some runny nose and a lot of repetitive cough because of drainage in her throat but now it is all chest congestion. Sounds like she is wheezing at times. Says that she feels like there is phlegm there but she cannot get it out. Says that she had been smoking some and thinks that was contributing to this wheezing.  Also requesting refill on her Celexa and Klonopin. Says that she was unable to come in for office visit to get refills so she has been out of medication since May. Since being off of Celexa, she can tell that she is feeling more anxious and stressed and uptight and does feel that she needs to get back on the medication. When she had the Klonopin available, she was using it very rarely.   No other complaints or concerns today.   Past Medical History:  Diagnosis Date  . Generalized anxiety disorder 02/09/2013     Home Meds: Outpatient Medications Prior to Visit  Medication Sig Dispense Refill  . citalopram (CELEXA) 20 MG tablet TAKE 1 TABLET BY MOUTH EVERY DAY (Patient not taking: Reported on 03/26/2016) 30 tablet 0  . clonazePAM (KLONOPIN) 0.5 MG tablet TAKE 1 TABLET TWICE A DAY AS NEEDED (Patient not taking: Reported on 03/26/2016) 30 tablet 1   No facility-administered medications prior to visit.     Allergies:  Allergies  Allergen Reactions  . Robitussin 12 Hour Cough [Dextromethorphan Polistirex Er] Itching    Social History   Social History  . Marital status: Single    Spouse name: N/A  . Number of children: N/A  . Years  of education: N/A   Occupational History  . Factory Work    Social History Main Topics  . Smoking status: Former Smoker    Types: Cigarettes    Quit date: 03/12/2013  . Smokeless tobacco: Never Used     Comment: cigarettes here and there  . Alcohol use No  . Drug use: No  . Sexual activity: No   Other Topics Concern  . 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    "Tries to live [place] Sexually Active by Choice"    Family History  Problem Relation Age of Onset  . Cancer Mother     cervical s/p hyst  . Hypertension Father   . Diabetes Brother      Review of Systems:  See HPI for pertinent ROS. All other ROS negative.    Physical Exam: Blood pressure 124/70, pulse (!) 56, temperature 97.8 F (36.6 C), temperature source Oral, resp. rate 16, weight 216 lb (98 kg)., Body mass index is 36.5 kg/m. General: WNWD AAF. Appears in no acute distress. Head: Normocephalic, atraumatic, eyes without discharge, sclera non-icteric, nares are without discharge. Bilateral auditory canals clear, TM's are without perforation, pearly grey and translucent with reflective cone of light bilaterally. Oral  cavity moist, posterior pharynx without exudate, erythema, peritonsillar abscess.  Neck: Supple. No thyromegaly. No lymphadenopathy. Lungs: Some harsh BS at bases bilaterally. O/W clear. Good air movement.  Heart: RRR with S1 S2. No murmurs, rubs, or gallops. Musculoskeletal:  Strength and tone normal for age. Extremities/Skin: Warm and dry.  Neuro: Alert and oriented X 3. Moves all extremities spontaneously. Gait is normal. CNII-XII grossly in tact. Psych:  Responds to questions appropriately with a normal affect.     ASSESSMENT AND PLAN:  60 y.o. year old female with  1. Bacterial respiratory infection 2. Smoker  She is to take antibiotic and  prednisone as directed. Also can use the albuterol as needed/as directed. F/U if breathing does not return to normal baseline within 1 week after completion of antibiotic and prednisone. Also discussed need for total smoking cessation. - azithromycin (ZITHROMAX) 250 MG tablet; Day 1: Take 2 daily.  Days 2-5: Take 1 daily.  Dispense: 6 tablet; Refill: 0 - predniSONE (DELTASONE) 20 MG tablet; Take 1 tablet (20 mg total) by mouth daily with breakfast.  Dispense: 5 tablet; Refill: 0 - albuterol (PROVENTIL HFA;VENTOLIN HFA) 108 (90 Base) MCG/ACT inhaler; Inhale 2 puffs into the lungs every 6 (six) hours as needed for wheezing or shortness of breath.  Dispense: 1 Inhaler; Refill: 0   2. Generalized anxiety disorder - clonazePAM (KLONOPIN) 0.5 MG tablet; Take 1 tablet (0.5 mg total) by mouth daily as needed.  Dispense: 30 tablet; Refill: 1 - citalopram (CELEXA) 20 MG tablet; Take 1 tablet (20 mg total) by mouth daily.  Dispense: 30 tablet; Refill: 5  At visit 03/26/16 I printed the above Rx for Klonopin. I also sent in the above Rx for Celexa. At [place] 03/26/2016--I did review my last routine office visit with her from November 2015. That time she had been on the Celexa and had been doing well with this medication and her anxiety was stable and controlled with this. I encouraged her to schedule a complete physical exam. She says that she will be able to schedule this for early morning so that she can come fasting for labs at the time of the visit.   Signed, [address] [place], [place], [address] [place], [place], [facility] 03/26/2016 3:50 PM

## 2016-08-02 ENCOUNTER — Other Ambulatory Visit: Payer: Self-pay | Admitting: Physician Assistant

## 2016-08-02 DIAGNOSIS — F411 Generalized anxiety disorder: Secondary | ICD-10-CM

## 2016-08-04 NOTE — Telephone Encounter (Signed)
Approved. #[id number]. 

## 2016-08-04 NOTE — Telephone Encounter (Signed)
Last OV 9-13 Last refill 9-13 Okay to refill?

## 2016-08-04 NOTE — Telephone Encounter (Signed)
Rx phoned in.   

## 2016-09-28 ENCOUNTER — Other Ambulatory Visit: Payer: Self-pay | Admitting: Physician Assistant

## 2016-09-28 DIAGNOSIS — F411 Generalized anxiety disorder: Secondary | ICD-10-CM

## 2017-04-26 ENCOUNTER — Other Ambulatory Visit: Payer: Self-pay | Admitting: Physician Assistant

## 2017-04-26 DIAGNOSIS — F411 Generalized anxiety disorder: Secondary | ICD-10-CM

## 2017-04-27 NOTE — Telephone Encounter (Signed)
Medication filled x1 with no refills.   Requires office visit before any further refills can be given.   Letter sent.  

## 2017-05-04 ENCOUNTER — Other Ambulatory Visit: Payer: Self-pay | Admitting: Physician Assistant

## 2017-05-04 DIAGNOSIS — F411 Generalized anxiety disorder: Secondary | ICD-10-CM

## 2017-05-04 NOTE — Telephone Encounter (Signed)
Never saw this patient.

## 2017-05-04 NOTE — Telephone Encounter (Signed)
Patient has not been seen by PA since 2017. Medication refused. Requires office visit before any further refills can be given.

## 2017-05-04 NOTE — Telephone Encounter (Signed)
Ok to refill 

## 2017-05-30 ENCOUNTER — Other Ambulatory Visit: Payer: Self-pay | Admitting: Physician Assistant

## 2017-05-30 DIAGNOSIS — F411 Generalized anxiety disorder: Secondary | ICD-10-CM

## 2017-06-01 NOTE — Telephone Encounter (Signed)
Last OV 03/26/2016.  Schedule OV.

## 2017-06-01 NOTE — Telephone Encounter (Signed)
Last ov 03/26/2017 Last refill 04/27/2017 Ok to refill?

## 2017-06-02 NOTE — Telephone Encounter (Signed)
Letter mailed to call and sch an appointment. Tried calling no answer

## 2019-07-24 ENCOUNTER — Other Ambulatory Visit: Payer: Self-pay

## 2019-07-24 ENCOUNTER — Emergency Department (HOSPITAL_COMMUNITY): Payer: 59

## 2019-07-24 ENCOUNTER — Emergency Department (HOSPITAL_COMMUNITY)
Admission: EM | Admit: 2019-07-24 | Discharge: 2019-07-24 | Disposition: A | Discharge status: Home / Self Care | Payer: 59 | Attending: Emergency Medicine | Admitting: Emergency Medicine

## 2019-07-24 DIAGNOSIS — Y9389 Activity, other specified: Secondary | ICD-10-CM | POA: Diagnosis not present

## 2019-07-24 DIAGNOSIS — Y999 Unspecified external cause status: Secondary | ICD-10-CM | POA: Insufficient documentation

## 2019-07-24 DIAGNOSIS — S99912A Unspecified injury of left ankle, initial encounter: Secondary | ICD-10-CM | POA: Diagnosis present

## 2019-07-24 DIAGNOSIS — Z87891 Personal history of nicotine dependence: Secondary | ICD-10-CM | POA: Diagnosis not present

## 2019-07-24 DIAGNOSIS — Y92009 Unspecified place in unspecified non-institutional (private) residence as the place of occurrence of the external cause: Secondary | ICD-10-CM | POA: Insufficient documentation

## 2019-07-24 DIAGNOSIS — S92015A Nondisplaced fracture of body of left calcaneus, initial encounter for closed fracture: Secondary | ICD-10-CM | POA: Diagnosis not present

## 2019-07-24 DIAGNOSIS — W010XXA Fall on same level from slipping, tripping and stumbling without subsequent striking against object, initial encounter: Secondary | ICD-10-CM | POA: Diagnosis not present

## 2019-07-24 DIAGNOSIS — S92002A Unspecified fracture of left calcaneus, initial encounter for closed fracture: Secondary | ICD-10-CM

## 2019-07-24 DIAGNOSIS — S82892A Other fracture of left lower leg, initial encounter for closed fracture: Secondary | ICD-10-CM

## 2019-07-24 NOTE — ED Provider Notes (Signed)
[facility] EMERGENCY DEPARTMENT Provider Note   CSN: [id number] Arrival date & time: 07/24/19  1509     History Chief Complaint  Patient presents with  . Ankle Injury    Kristi Sims is a 64 y.o. female.  The history is provided by the patient. No language interpreter was used.  Ankle Injury This is a new problem. The current episode started 3 to 5 hours ago. The problem occurs constantly. The problem has been gradually worsening. Pertinent negatives include no chest pain. Nothing aggravates the symptoms. Nothing relieves the symptoms. She has tried nothing for the symptoms. The treatment provided no relief.  Pt reports she slipped and fell while putting up christmas decorations in her shed. Pt complains of pain to her foot and ankle      Past Medical History:  Diagnosis Date  . Generalized anxiety disorder 02/09/2013    Patient Active Problem List   Diagnosis Date Noted  . Screening for colorectal cancer 10/13/2013  . Obesity 10/13/2013  . Generalized anxiety disorder 02/09/2013    Past Surgical History:  Procedure Laterality Date  . BREAST SURGERY     both breasts benign  . GANGLION CYST EXCISION Left    wrist     OB History   No obstetric history on file.     Family History  Problem Relation Age of Onset  . Cancer Mother        cervical s/p hyst  . Hypertension Father   . Diabetes Brother     Social History   Tobacco Use  . Smoking status: Former Smoker    Types: Cigarettes    Quit date: 03/12/2013    Years since quitting: 6.3  . Smokeless tobacco: Never Used  . Tobacco comment: cigarettes here and there  Substance Use Topics  . Alcohol use: No  . Drug use: No    Home Medications Prior to Admission medications   Medication Sig Start Date End Date Taking? Authorizing Provider  albuterol (PROVENTIL HFA;VENTOLIN HFA) 108 (90 Base) MCG/ACT inhaler Inhale 2 puffs into the lungs every 6 (six) hours as needed for wheezing or shortness of breath.  03/26/16   Dena Billet B, PA-C  azithromycin (ZITHROMAX) 250 MG tablet Day 1: Take 2 daily.  Days 2-5: Take 1 daily. 03/26/16   Orlena Sheldon, PA-C  citalopram (CELEXA) 20 MG tablet TAKE 1 TABLET BY MOUTH EVERY DAY 04/27/17   Dena Billet B, PA-C  clonazePAM (KLONOPIN) 0.5 MG tablet TAKE 1 TABLET BY MOUTH EVERY DAY AS NEEDED 08/04/16   Orlena Sheldon, PA-C  predniSONE (DELTASONE) 20 MG tablet Take 1 tablet (20 mg total) by mouth daily with breakfast. 03/26/16   Orlena Sheldon, PA-C    Allergies    Robitussin 12 hour cough [dextromethorphan polistirex er]  Review of Systems   Review of Systems  Cardiovascular: Negative for chest pain.  All other systems reviewed and are negative.   Physical Exam Updated Vital Signs BP (!) 141/67 (BP Location: Right Arm)   Pulse 70   Temp 98.4 F (36.9 C) (Oral)   Resp 15   Ht 5\' 6"  (1.676 m)   Wt 98.9 kg   SpO2 100%   BMI 35.19 kg/m   Physical Exam Vitals and nursing note reviewed.  Constitutional:      Appearance: She is well-developed.  HENT:     Head: Normocephalic.  Pulmonary:     Effort: Pulmonary effort is normal.  Abdominal:     General:  There is no distension.  Musculoskeletal:        General: Swelling and tenderness present. Normal range of motion.     Comments: Swollen tender foot and ankle, pain with movement, nv and ns intact  Skin:    General: Skin is warm.  Neurological:     General: No focal deficit present.     Mental Status: She is alert and oriented to person, place, and time.  Psychiatric:        Mood and Affect: Mood normal.     ED Results / Procedures / Treatments   Labs (all labs ordered are listed, but only abnormal results are displayed) Labs Reviewed - No data to display  EKG None  Radiology DG Ankle Complete Left  Result Date: 07/24/2019 CLINICAL DATA:  Pain EXAM: LEFT ANKLE COMPLETE - 3+ VIEW COMPARISON:  None. FINDINGS: There is an acute lateral calcaneal avulsion fracture with surrounding soft tissue  swelling. There is no dislocation. There is a moderate-sized plantar calcaneal spur. There is an Achilles tendon enthesophyte. IMPRESSION: Small avulsion fracture arising from the lateral calcaneus. Electronically Signed   By: Constance Holster M.D.   On: 07/24/2019 17:25   DG Foot Complete Left  Result Date: 07/24/2019 CLINICAL DATA:  Pain status post fall EXAM: LEFT FOOT - COMPLETE 3+ VIEW COMPARISON:  None. FINDINGS: There is a small avulsion fracture arising from the lateral calcaneus. There is a small plantar calcaneal spur. There is an Achilles tendon enthesophyte. There is soft tissue swelling about the foot. IMPRESSION: Acute avulsion fracture arising from the lateral calcaneus with surrounding soft tissue swelling. Electronically Signed   By: Constance Holster M.D.   On: 07/24/2019 17:24    Procedures Procedures (including critical care time)  Medications Ordered in ED Medications - No data to display  ED Course  I have reviewed the triage vital signs and the nursing notes.  Pertinent labs & imaging results that were available during my care of the patient were reviewed by me and considered in my medical decision making (see chart for details).    MDM Rules/Calculators/A&P                      MDM  Pt placed in a cam walker and given crutches  Final Clinical Impression(s) / ED Diagnoses Final diagnoses:  Closed fracture of left ankle, initial encounter  Closed nondisplaced fracture of left calcaneus, unspecified portion of calcaneus, initial encounter    Rx / DC Orders ED Discharge Orders    None    An After Visit Summary was printed and given to the patient.    Sidney Ace 07/24/19 2142    Milton Ferguson, MD 07/24/19 2157

## 2019-07-24 NOTE — Discharge Instructions (Addendum)
Return if any problems.

## 2019-07-24 NOTE — ED Triage Notes (Signed)
PT STATES THAT SHE FELL AND HURT HER LEFT ANKLE IT IS HURTING AND SWOLLEN PER PATIENT

## 2019-07-27 ENCOUNTER — Encounter: Payer: Self-pay | Admitting: Orthopedic Surgery

## 2019-07-27 ENCOUNTER — Other Ambulatory Visit: Payer: Self-pay

## 2019-07-27 ENCOUNTER — Ambulatory Visit: Payer: 59 | Admitting: Orthopedic Surgery

## 2019-07-27 VITALS — BP 157/70 | HR 80 | Ht 66.0 in | Wt 218.0 lb

## 2019-07-27 DIAGNOSIS — S92035A Nondisplaced avulsion fracture of tuberosity of left calcaneus, initial encounter for closed fracture: Secondary | ICD-10-CM

## 2019-07-27 DIAGNOSIS — W102XXA Fall (on)(from) incline, initial encounter: Secondary | ICD-10-CM

## 2019-07-27 NOTE — Patient Instructions (Signed)
Weight bearing as tolerated   Wear the boot when walking remove at bedtime   Ibuprofen or tylenol is ok for swelling   Elevate as much as possible

## 2019-07-27 NOTE — Progress Notes (Signed)
Kristi Sims  07/27/2019  Body mass index is 35.19 kg/m.   HISTORY SECTION :  Chief Complaint  Patient presents with  . Ankle Injury    left ankle injury fall on slippery ramp storage building 07/24/19   Emergency room images and notes are reviewed patient injured herself putting up Christmas decorations  Date of injury January 10  Patient planes of pain and swelling left foot  X-rays indicate avulsion fracture from the calcaneus  Patient was placed in a cam walker given crutches told to be weightbearing with crutches and Cam walker comes in for evaluation and management  She would like to continue working at the [employer] center  She indicates negative review of systems    ROS  As noted   has a past medical history of Generalized anxiety disorder (02/09/2013).   Past Surgical History:  Procedure Laterality Date  . BREAST SURGERY     both breasts benign  . GANGLION CYST EXCISION Left    wrist    Body mass index is 35.19 kg/m.   Allergies  Allergen Reactions  . Robitussin 12 Hour Cough [Dextromethorphan Polistirex Er] Itching     Current Outpatient Medications:  .  citalopram (CELEXA) 20 MG tablet, TAKE 1 TABLET BY MOUTH EVERY DAY (Patient not taking: Reported on 07/27/2019), Disp: 30 tablet, Rfl: 0   PHYSICAL EXAM SECTION: 1) BP (!) 157/70   Pulse 80   Ht 5\' 6"  (1.676 m)   Wt 218 lb (98.9 kg)   BMI 35.19 kg/m   Body mass index is 35.19 kg/m. General appearance: Well-developed well-nourished no gross deformities  2) Cardiovascular normal pulse and perfusion in the left lower extremities normal color without edema  3) Neurologically deep tendon reflexes are equal and normal, no sensation loss or deficits no pathologic reflexes  4) Psychological: Awake alert and oriented x3 mood and affect normal  5) Skin no lacerations or ulcerations no nodularity no palpable masses, no erythema or nodularity  6) Musculoskeletal: Left ankle and foot are  tender most of the swelling and tenderness however in the foot and below the fibula  Ankle joint is stable muscle tone is normal ankle range of motion is limited by pain and swelling in the foot   MEDICAL DECISION SECTION:  Encounter Diagnosis  Name Primary?  . Closed nondisplaced avulsion fracture of tuberosity of left calcaneus, initial encounter Yes    Imaging Independent x-ray interpretation ankle x-rays avulsion fracture near the calcaneus ankle mortise is intact  Plan:  (Rx., Inj., surg., Frx, MRI/CT, XR:2)  Recommend weightbearing as tolerated with a cam walker and crutches but crutches can be discontinued when stable  Recommend Tylenol Advil for pain  Follow-up in 6 weeks for office visit (not fracture care)  Summation acute uncomplicated injury  X-ray reading interpretation independent with outside records   2:02 PM Arther Abbott, MD  07/27/2019

## 2019-08-01 DIAGNOSIS — S92002A Unspecified fracture of left calcaneus, initial encounter for closed fracture: Secondary | ICD-10-CM | POA: Diagnosis not present

## 2019-08-01 DIAGNOSIS — S8292XA Unspecified fracture of left lower leg, initial encounter for closed fracture: Secondary | ICD-10-CM | POA: Diagnosis not present

## 2019-09-05 ENCOUNTER — Ambulatory Visit (INDEPENDENT_AMBULATORY_CARE_PROVIDER_SITE_OTHER): Payer: 59 | Admitting: Orthopedic Surgery

## 2019-09-05 ENCOUNTER — Other Ambulatory Visit: Payer: Self-pay

## 2019-09-05 DIAGNOSIS — S92035D Nondisplaced avulsion fracture of tuberosity of left calcaneus, subsequent encounter for fracture with routine healing: Secondary | ICD-10-CM | POA: Diagnosis not present

## 2019-09-05 NOTE — Patient Instructions (Signed)
Resume normal activity

## 2019-09-05 NOTE — Progress Notes (Signed)
Chief Complaint  Patient presents with  . Foot Pain    L/DOI 07/24/19 feeling better    64 year old female injured her left foot had an avulsion fracture on the lateral border of the foot about 4 weeks ago says she feels better with an occasional pain that runs through the foot not constant seems to be intermittent  Exam I did not reproduce any tenderness in the foot the swelling is down the motion is excellent the neurovascular exam is intact  Encounter Diagnosis  Name Primary?  . Closed nondisplaced avulsion fracture of tuberosity of left calcaneus with routine healing, subsequent encounter 07/24/19 Yes    Resume normal activity remove boot follow-up as needed

## 2020-01-04 ENCOUNTER — Ambulatory Visit (INDEPENDENT_AMBULATORY_CARE_PROVIDER_SITE_OTHER): Payer: 59 | Admitting: Nurse Practitioner

## 2020-01-04 ENCOUNTER — Other Ambulatory Visit: Payer: Self-pay

## 2020-01-04 ENCOUNTER — Encounter: Payer: Self-pay | Admitting: Nurse Practitioner

## 2020-01-04 VITALS — BP 120/78 | HR 54 | Temp 97.6°F | Resp 18 | Ht 65.0 in | Wt 223.2 lb

## 2020-01-04 DIAGNOSIS — E559 Vitamin D deficiency, unspecified: Secondary | ICD-10-CM | POA: Diagnosis not present

## 2020-01-04 DIAGNOSIS — Z13228 Encounter for screening for other metabolic disorders: Secondary | ICD-10-CM

## 2020-01-04 DIAGNOSIS — Z1159 Encounter for screening for other viral diseases: Secondary | ICD-10-CM

## 2020-01-04 DIAGNOSIS — Z0001 Encounter for general adult medical examination with abnormal findings: Secondary | ICD-10-CM | POA: Diagnosis not present

## 2020-01-04 DIAGNOSIS — Z0000 Encounter for general adult medical examination without abnormal findings: Secondary | ICD-10-CM

## 2020-01-04 DIAGNOSIS — Z7689 Persons encountering health services in other specified circumstances: Secondary | ICD-10-CM

## 2020-01-04 DIAGNOSIS — Z13220 Encounter for screening for lipoid disorders: Secondary | ICD-10-CM

## 2020-01-04 DIAGNOSIS — Z114 Encounter for screening for human immunodeficiency virus [HIV]: Secondary | ICD-10-CM | POA: Diagnosis not present

## 2020-01-04 NOTE — Progress Notes (Signed)
New Patient Office Visit  Subjective:  Patient ID: MI BALLA, female    DOB: 08/22/1955  Age: 64 y.o. MRN: [id number]  CC:  Chief Complaint  Patient presents with  . Annual Exam    HPI Kristi Sims is a 64 year old female presenting for general adult exam and to establish care. The pt feels in overall good health. She has a GYN and plans to make apt for PAP and Mammogram. She is not sure if she is UTD on her Tdap vaccine but thinks that she did complete with recent employee onboard. She will check with her employer. She has not completed colorectal screening. After discussing options she will consider Cologuard and let provider know her choice. Blood pressure, diet, and exercise, treatment goals discussed.   No cp, ct. gu/gi sxs, pain, sob, edema, palpitation, or recent injury/falls.  Past Medical History:  Diagnosis Date  . Allergy    Phreesia 01/01/2020  . Generalized anxiety disorder 02/09/2013    Past Surgical History:  Procedure Laterality Date  . BREAST SURGERY     both breasts benign  . GANGLION CYST EXCISION Left    wrist    Family History  Problem Relation Age of Onset  . Cancer Mother        cervical s/p hyst  . Hypertension Father   . Diabetes Brother     Social History   Socioeconomic History  . Marital status: Single    Spouse name: Not on file  . Number of children: Not on file  . Years of education: Not on file  . Highest education level: Not on file  Occupational History  . Occupation: Factory Work  Tobacco Use  . Smoking status: Former Smoker    Types: Cigarettes    Quit date: 03/12/2013    Years since quitting: 6.8  . Smokeless tobacco: Never Used  . Tobacco comment: cigarettes here and there  Substance and Sexual Activity  . Alcohol use: No  . Drug use: No  . Sexual activity: Never    Birth control/protection: Abstinence  Other Topics Concern  . 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    "Tries to live [employer] Active by Choice"   Social Determinants of Health   Financial Resource Strain:   . Difficulty of Paying Living Expenses:   Food Insecurity:   . Worried About Charity fundraiser in the Last Year:   . Arboriculturist in the Last Year:   Transportation Needs:   . Film/video editor (Medical):   Marland Kitchen Lack of Transportation (Non-Medical):   Physical Activity:   . Days of Exercise per Week:   . Minutes of Exercise per Session:   Stress:   . Feeling of Stress :   Social Connections:   . Frequency of Communication with Friends and Family:   . Frequency of Social Gatherings with Friends and Family:   . Attends Religious Services:   . Active Member of Clubs or Organizations:   . Attends Archivist Meetings:   Marland Kitchen Marital Status:   Intimate Partner Violence:   . Fear of Current or Ex-Partner:   . Emotionally Abused:   Marland Kitchen Physically Abused:   . Sexually Abused:     ROS Review of Systems  All other systems reviewed  and are negative.   Objective:   Today's Vitals: BP 120/78 (BP Location: Left Arm, Patient Position: Sitting, Cuff Size: Large)   Pulse (!) 54   Temp 97.6 F (36.4 C) (Temporal)   Resp 18   Ht 5\' 5"  (1.651 m)   Wt 223 lb 3.2 oz (101.2 kg)   SpO2 96%   BMI 37.14 kg/m   Physical Exam Vitals and nursing note reviewed.  Constitutional:      Appearance: Normal appearance. She is well-developed and well-groomed. She is not ill-appearing, toxic-appearing or diaphoretic.  HENT:     Head: Normocephalic.     Right Ear: Hearing and external ear normal.     Left Ear: Hearing and external ear normal.     Nose: Nose normal.     Mouth/Throat:     Lips: Pink.     Mouth: Mucous membranes are moist.     Dentition: Normal dentition.     Tongue: No lesions. Tongue does not deviate from midline.     Pharynx: Oropharynx is clear.    Eyes:     General: Lids are normal. Lids are everted, no foreign bodies appreciated.     Extraocular Movements: Extraocular movements intact.     Right eye: Normal extraocular motion.     Left eye: Normal extraocular motion.     Conjunctiva/sclera: Conjunctivae normal.     Right eye: Right conjunctiva is not injected.     Left eye: Left conjunctiva is not injected.     Pupils: Pupils are equal, round, and reactive to light.  Neck:     Thyroid: No thyromegaly or thyroid tenderness.     Vascular: No carotid bruit or JVD.  Cardiovascular:     Rate and Rhythm: Normal rate and regular rhythm.     Pulses: Normal pulses.     Heart sounds: Normal heart sounds, S1 normal and S2 normal.  Pulmonary:     Effort: Pulmonary effort is normal.     Breath sounds: Normal breath sounds.  Abdominal:     General: Abdomen is flat. Bowel sounds are normal. There is no abdominal bruit.     Palpations: Abdomen is soft. There is no hepatomegaly or splenomegaly.     Tenderness: There is no right CVA tenderness, left CVA tenderness, guarding or rebound.  Musculoskeletal:        General: Normal range of motion.     Cervical back: Full passive range of motion without pain, normal range of motion and neck supple. No rigidity.     Right lower leg: No edema.     Left lower leg: No edema.  Lymphadenopathy:     Head:     Right side of head: No submental, submandibular or tonsillar adenopathy.     Left side of head: No submental, submandibular or tonsillar adenopathy.     Cervical: No cervical adenopathy.  Skin:    General: Skin is warm and dry.     Capillary Refill: Capillary refill takes less than 2 seconds.     Coloration: Skin is not jaundiced.     Findings: No bruising or erythema.  Neurological:     General: No focal deficit present.     Mental Status: She is alert and oriented to person, place, and time.     GCS: GCS eye subscore is 4. GCS verbal subscore is 5. GCS motor subscore is 6.     Cranial  Nerves: No cranial nerve deficit.  Psychiatric:  Attention and Perception: Attention normal.        Mood and Affect: Mood normal.        Speech: Speech normal.        Behavior: Behavior is cooperative.        Cognition and Memory: Cognition normal.        Judgment: Judgment normal.     Assessment & Plan:   Problem List Items Addressed This Visit    None    Visit Diagnoses    Screening, lipid    -  Primary   Relevant Orders   Lipid panel   Vitamin D deficiency       Relevant Orders   VITAMIN D 25 Hydroxy (Vit-D Deficiency, Fractures)   Screening for metabolic disorder       Relevant Orders   CBC with Differential/Platelet   COMPLETE METABOLIC PANEL WITH GFR   Need for hepatitis C screening test       Relevant Orders   Hepatitis C antibody   Encounter for screening for HIV       Relevant Orders   HIV Antibody (routine testing w rflx)   Adult general medical exam       Encounter to establish care        Drink plenty of water  Get 20 minutes of exercise such as walking daily at least 4 times per week  Follow high fiber, low carbohydrate, low sugar, low fat/cholesterol diet  Blood pressure goals 140/90 or less seek medical attention for out of this range  Yearly wellness exam  Labs today and will call with abnormal and directions for addition follow up and treatment plan  Consider Pneumonia and Shingrex vaccine  Consider Cologuard or colonoscopy  Schedule GYN for PAP and Mammogram   Outpatient Encounter Medications as of 01/04/2020  Medication Sig  . citalopram (CELEXA) 20 MG tablet TAKE 1 TABLET BY MOUTH EVERY DAY (Patient not taking: Reported on 07/27/2019)   No facility-administered encounter medications on file as of 01/04/2020.    Follow-up: Return in about 1 year (around 01/03/2021) for lab one week prior.   Annie Main, FNP

## 2020-01-04 NOTE — Patient Instructions (Addendum)
Drink plenty of water  Get 20 minutes of exercise such as walking daily at least 4 times per week  Follow high fiber, low carbohydrate, low sugar, low fat/cholesterol diet  Blood pressure goals 140/90 or less seek medical attention for out of this range  Yearly wellness exam  Labs today and will call with abnormal and directions for addition follow up and treatment plan  Consider Pneumonia and Shingrex vaccine  Consider Cologuard or colonoscopy  Schedule GYN for PAP and Mammogram

## 2020-01-05 ENCOUNTER — Other Ambulatory Visit: Payer: Self-pay | Admitting: Nurse Practitioner

## 2020-01-05 DIAGNOSIS — E559 Vitamin D deficiency, unspecified: Secondary | ICD-10-CM

## 2020-01-05 LAB — HEPATITIS C ANTIBODY
Hepatitis C Ab: NONREACTIVE
SIGNAL TO CUT-OFF: 0.02 (ref <1.00)

## 2020-01-05 LAB — VITAMIN D 25 HYDROXY (VIT D DEFICIENCY, FRACTURES): Vit D, 25-Hydroxy: 11 ng/mL — ABNORMAL LOW (ref 30–100)

## 2020-01-05 LAB — COMPLETE METABOLIC PANEL WITH GFR
AG Ratio: 1.7 (calc) (ref 1.0–2.5)
ALT: 22 U/L (ref 6–29)
AST: 18 U/L (ref 10–35)
Albumin: 4.4 g/dL (ref 3.6–5.1)
Alkaline phosphatase (APISO): 89 U/L (ref 37–153)
BUN: 10 mg/dL (ref 7–25)
CO2: 25 mmol/L (ref 20–32)
Calcium: 10 mg/dL (ref 8.6–10.4)
Chloride: 101 mmol/L (ref 98–110)
Creat: 0.84 mg/dL (ref 0.50–0.99)
GFR, Est African American: 86 mL/min/{1.73_m2} (ref >=60)
GFR, Est Non African American: 74 mL/min/{1.73_m2} (ref >=60)
Globulin: 2.6 g/dL (calc) (ref 1.9–3.7)
Glucose, Bld: 73 mg/dL (ref 65–99)
Potassium: 5 mmol/L (ref 3.5–5.3)
Sodium: 140 mmol/L (ref 135–146)
Total Bilirubin: 0.5 mg/dL (ref 0.2–1.2)
Total Protein: 7 g/dL (ref 6.1–8.1)

## 2020-01-05 LAB — CBC WITH DIFFERENTIAL/PLATELET
Absolute Monocytes: 468 cells/uL (ref 200–950)
Basophils Absolute: 61 cells/uL (ref 0–200)
Basophils Relative: 1.1 %
Eosinophils Absolute: 83 cells/uL (ref 15–500)
Eosinophils Relative: 1.5 %
HCT: 44.7 % (ref 35.0–45.0)
Hemoglobin: 14.3 g/dL (ref 11.7–15.5)
Lymphs Abs: 2728 cells/uL (ref 850–3900)
MCH: 28.4 pg (ref 27.0–33.0)
MCHC: 32 g/dL (ref 32.0–36.0)
MCV: 88.9 fL (ref 80.0–100.0)
MPV: 10.1 fL (ref 7.5–12.5)
Monocytes Relative: 8.5 %
Neutro Abs: 2162 cells/uL (ref 1500–7800)
Neutrophils Relative %: 39.3 %
Platelets: 464 10*3/uL — ABNORMAL HIGH (ref 140–400)
RBC: 5.03 10*6/uL (ref 3.80–5.10)
RDW: 11.2 % (ref 11.0–15.0)
Total Lymphocyte: 49.6 %
WBC: 5.5 10*3/uL (ref 3.8–10.8)

## 2020-01-05 LAB — LIPID PANEL
Cholesterol: 210 mg/dL — ABNORMAL HIGH (ref <200)
HDL: 73 mg/dL (ref >=50)
LDL Cholesterol (Calc): 122 mg/dL (calc) — ABNORMAL HIGH
Non-HDL Cholesterol (Calc): 137 mg/dL (calc) — ABNORMAL HIGH (ref <130)
Total CHOL/HDL Ratio: 2.9 (calc) (ref <5.0)
Triglycerides: 62 mg/dL (ref <150)

## 2020-01-05 LAB — HIV ANTIBODY (ROUTINE TESTING W REFLEX): HIV 1&2 Ab, 4th Generation: NONREACTIVE

## 2020-01-05 MED ORDER — VITAMIN D (ERGOCALCIFEROL) 1.25 MG (50000 UNIT) PO CAPS: 50000.0000 [IU] | ORAL_CAPSULE | ORAL | 1 refills | Status: DC

## 2020-01-05 NOTE — Progress Notes (Signed)
Cholesterol and triglycerides are elevated.  Vitamin D low, RX' d vitamin d3 weekly dose for 8 weeks then continue ongoing to take daily over the counter.   The platelet count is a bit high. This could be end of infection or beginning. WBC is normal. If there are any signs of infection seek medical attention.   Follow up labs in 6 months with apt.

## 2020-02-24 ENCOUNTER — Other Ambulatory Visit: Payer: Self-pay | Admitting: Nurse Practitioner

## 2020-02-24 DIAGNOSIS — E559 Vitamin D deficiency, unspecified: Secondary | ICD-10-CM

## 2020-03-12 DIAGNOSIS — Z6838 Body mass index (BMI) 38.0-38.9, adult: Secondary | ICD-10-CM | POA: Diagnosis not present

## 2020-03-12 DIAGNOSIS — B373 Candidiasis of vulva and vagina: Secondary | ICD-10-CM | POA: Diagnosis not present

## 2020-03-12 DIAGNOSIS — Z1231 Encounter for screening mammogram for malignant neoplasm of breast: Secondary | ICD-10-CM | POA: Diagnosis not present

## 2020-03-12 DIAGNOSIS — Z01419 Encounter for gynecological examination (general) (routine) without abnormal findings: Secondary | ICD-10-CM | POA: Diagnosis not present

## 2020-03-12 DIAGNOSIS — R350 Frequency of micturition: Secondary | ICD-10-CM | POA: Diagnosis not present

## 2020-03-21 ENCOUNTER — Other Ambulatory Visit: Payer: Self-pay | Admitting: Obstetrics and Gynecology

## 2020-03-21 ENCOUNTER — Encounter: Payer: Self-pay | Admitting: Gastroenterology

## 2020-03-21 DIAGNOSIS — N951 Menopausal and female climacteric states: Secondary | ICD-10-CM

## 2020-04-20 ENCOUNTER — Other Ambulatory Visit: Payer: Self-pay | Admitting: Family Medicine

## 2020-04-20 NOTE — Telephone Encounter (Signed)
Medication is not indicated.   8 week course completed.

## 2020-04-20 NOTE — Telephone Encounter (Signed)
Received fax requesting a refill on   Pharmacy:[facility] [address]  Medication:Vitamin D, Ergocalciferol, (DRISDOL) 1.25 MG  Qty:4.0 ea four  Sig: TAKE 1 CAPSULE (50,000 UNITS TOTAL) BY MOUTH EVERY 7 (SEVEN) DAYS.

## 2020-05-02 ENCOUNTER — Other Ambulatory Visit: Payer: Self-pay

## 2020-05-02 ENCOUNTER — Encounter: Payer: Self-pay | Admitting: Gastroenterology

## 2020-05-02 ENCOUNTER — Ambulatory Visit (AMBULATORY_SURGERY_CENTER): Payer: Self-pay | Admitting: *Deleted

## 2020-05-02 VITALS — Ht 64.25 in | Wt 231.0 lb

## 2020-05-02 DIAGNOSIS — Z1211 Encounter for screening for malignant neoplasm of colon: Secondary | ICD-10-CM

## 2020-05-02 MED ORDER — SUPREP BOWEL PREP KIT 17.5-3.13-1.6 GM/177ML PO SOLN: 1.0000 | Freq: Once | ORAL | 0 refills | Status: AC

## 2020-05-02 NOTE — Progress Notes (Signed)
cov vax x 2 - last 10-05-19   No egg or soy allergy known to patient  No issues with past sedation with any surgeries or procedures no intubation problems in the past  No FH of Malignant Hyperthermia No diet pills per patient No home 02 use per patient  No blood thinners per patient  Pt denies issues with constipation  No A fib or A flutter  EMMI video to pt or via [facility] 19 guidelines implemented in PV today with Pt and RN      Due to the COVID-19 pandemic we are asking patients to follow these guidelines. Please only bring one care partner. Please be aware that your care partner may wait in the car in the parking lot or if they feel like they will be too hot to wait in the car, they may wait in the lobby on the 4th floor. All care partners are required to wear a mask the entire time (we do not have any that we can provide them), they need to practice social distancing, and we will do a Covid check for all patient's and care partners when you arrive. Also we will check their temperature and your temperature. If the care partner waits in their car they need to stay in the parking lot the entire time and we will call them on their cell phone when the patient is ready for discharge so they can bring the car to the front of the building. Also all patient's will need to wear a mask into building.

## 2020-05-16 ENCOUNTER — Other Ambulatory Visit: Payer: Self-pay

## 2020-05-16 ENCOUNTER — Ambulatory Visit (AMBULATORY_SURGERY_CENTER): Payer: 59 | Admitting: Gastroenterology

## 2020-05-16 ENCOUNTER — Encounter: Payer: Self-pay | Admitting: Gastroenterology

## 2020-05-16 VITALS — BP 105/54 | HR 50 | Temp 97.8°F | Resp 12 | Ht 64.0 in | Wt 231.0 lb

## 2020-05-16 DIAGNOSIS — K635 Polyp of colon: Secondary | ICD-10-CM

## 2020-05-16 DIAGNOSIS — Z1211 Encounter for screening for malignant neoplasm of colon: Secondary | ICD-10-CM | POA: Diagnosis not present

## 2020-05-16 DIAGNOSIS — D124 Benign neoplasm of descending colon: Secondary | ICD-10-CM

## 2020-05-16 MED ORDER — SODIUM CHLORIDE 0.9 % IV SOLN: 500.0000 mL | Freq: Once | INTRAVENOUS | Status: DC

## 2020-05-16 NOTE — Progress Notes (Signed)
Called to room to assist during endoscopic procedure.  Patient ID and intended procedure confirmed with present staff. Received instructions for my participation in the procedure from the performing physician.  

## 2020-05-16 NOTE — Patient Instructions (Signed)
Handouts on polyps, diverticulosis, and high fiber diet given to you today.  Await pathology results     YOU HAD AN ENDOSCOPIC PROCEDURE TODAY AT THE [facility]:   Refer to the procedure report that was given to you for any specific questions about what was found during the examination.  If the procedure report does not answer your questions, please call your gastroenterologist to clarify.  If you requested that your care partner not be given the details of your procedure findings, then the procedure report has been included in a sealed envelope for you to review at your convenience later.  YOU SHOULD EXPECT: Some feelings of bloating in the abdomen. Passage of more gas than usual.  Walking can help get rid of the air that was put into your GI tract during the procedure and reduce the bloating. If you had a lower endoscopy (such as a colonoscopy or flexible sigmoidoscopy) you may notice spotting of blood in your stool or on the toilet paper. If you underwent a bowel prep for your procedure, you may not have a normal bowel movement for a few days.  Please Note:  You might notice some irritation and congestion in your nose or some drainage.  This is from the oxygen used during your procedure.  There is no need for concern and it should clear up in a day or so.  SYMPTOMS TO REPORT IMMEDIATELY:   Following lower endoscopy (colonoscopy or flexible sigmoidoscopy):  Excessive amounts of blood in the stool  Significant tenderness or worsening of abdominal pains  Swelling of the abdomen that is new, acute  Fever of 100F or higher  For urgent or emergent issues, a gastroenterologist can be reached at any hour by calling [phone]. Do not use MyChart messaging for urgent concerns.    DIET:  We do recommend a small meal at first, but then you may proceed to your regular diet.  Drink plenty of fluids but you should avoid alcoholic beverages for 24 hours.  ACTIVITY:  You should plan  to take it easy for the rest of today and you should NOT DRIVE or use heavy machinery until tomorrow (because of the sedation medicines used during the test).    FOLLOW UP: Our staff will call the number listed on your records 48-72 hours following your procedure to check on you and address any questions or concerns that you may have regarding the information given to you following your procedure. If we do not reach you, we will leave a message.  We will attempt to reach you two times.  During this call, we will ask if you have developed any symptoms of COVID 19. If you develop any symptoms (ie: fever, flu-like symptoms, shortness of breath, cough etc.) before then, please call [phone].  If you test positive for Covid 19 in the 2 weeks post procedure, please call and report this information to us.    If any biopsies were taken you will be contacted by phone or by letter within the next 1-3 weeks.  Please call us at [phone] if you have not heard about the biopsies in 3 weeks.    SIGNATURES/CONFIDENTIALITY: You and/or your care partner have signed paperwork which will be entered into your electronic medical record.  These signatures attest to the fact that that the information above on your After Visit Summary has been reviewed and is understood.  Full responsibility of the confidentiality of this discharge information lies with you and/or your   care-partner.  

## 2020-05-16 NOTE — Progress Notes (Signed)
PT taken to PACU. Monitors in place. VSS. Report given to RN. 

## 2020-05-16 NOTE — Progress Notes (Signed)
Pt's states no medical or surgical changes since previsit or office visit. VS by CW. 

## 2020-05-16 NOTE — Op Note (Signed)
[facility] Patient Name: Kristi Sims Procedure Date: 05/16/2020 8:39 AM MRN: [id number] Endoscopist: Thornton Park MD, MD Age: 64 Referring MD:  Date of Birth: Feb 02, 1956 Gender: Female Account #: [id number] Procedure:                Colonoscopy Indications:              Screening for colorectal malignant neoplasm, This                            is the patient's first colonoscopy                           No known family history of colon cancer or polyps                           No baseline GI symptoms Medicines:                Monitored Anesthesia Care Procedure:                Pre-Anesthesia Assessment:                           - Prior to the procedure, a History and Physical                            was performed, and patient medications and                            allergies were reviewed. The patient's tolerance of                            previous anesthesia was also reviewed. The risks                            and benefits of the procedure and the sedation                            options and risks were discussed with the patient.                            All questions were answered, and informed consent                            was obtained. Prior Anticoagulants: The patient has                            taken no previous anticoagulant or antiplatelet                            agents. ASA Grade Assessment: III - A patient with                            severe systemic disease. After reviewing the risks  and benefits, the patient was deemed in                            satisfactory condition to undergo the procedure.                           After obtaining informed consent, the colonoscope                            was passed under direct vision. Throughout the                            procedure, the patient's blood pressure, pulse, and                            oxygen saturations were monitored continuously.  The                            Colonoscope was introduced through the anus and                            advanced to the the cecum, identified by                            appendiceal orifice and ileocecal valve. A second                            forward view of the right colon was performed. The                            colonoscopy was performed without difficulty. The                            patient tolerated the procedure well. The quality                            of the bowel preparation was good. The terminal                            ileum, ileocecal valve, appendiceal orifice, and                            rectum were photographed. Scope In: 8:46:16 AM Scope Out: 9:00:10 AM Scope Withdrawal Time: 0 hours 9 minutes 34 seconds  Total Procedure Duration: 0 hours 13 minutes 54 seconds  Findings:                 The perianal and digital rectal examinations were                            normal.                           Multiple small and large-mouthed diverticula were  found in the sigmoid colon and descending colon.                           A 1 mm polyp was found in the descending colon. The                            polyp was sessile. The polyp was removed with a                            cold snare. Resection and retrieval were complete.                            Estimated blood loss was minimal.                           The exam was otherwise without abnormality on                            direct and retroflexion views. Complications:            No immediate complications. Estimated blood loss:                            Minimal. Estimated Blood Loss:     Estimated blood loss was minimal. Impression:               - Diverticulosis in the sigmoid colon and in the                            descending colon.                           - One 1 mm polyp in the descending colon, removed                            with a cold snare.  Resected and retrieved.                           - The examination was otherwise normal on direct                            and retroflexion views. Recommendation:           - Patient has a contact number available for                            emergencies. The signs and symptoms of potential                            delayed complications were discussed with the                            patient. Return to normal activities tomorrow.  Written discharge instructions were provided to the                            patient.                           - Follow a high fiber diet. Drink at least 64                            ounces of water daily. Add a daily stool bulking                            agent such as psyllium (an exampled would be                            Metamucil).                           - Continue present medications.                           - Await pathology results.                           - Repeat colonoscopy date to be determined after                            pending pathology results are reviewed for                            surveillance.                           - Emerging evidence supports eating a diet of                            fruits, vegetables, grains, calcium, and yogurt                            while reducing red meat and alcohol may reduce the                            risk of colon cancer.                           - Thank you for allowing me to be involved in your                            colon cancer prevention. Thornton Park MD, MD 05/16/2020 9:04:52 AM This report has been signed electronically.

## 2020-05-18 ENCOUNTER — Telehealth: Payer: Self-pay | Admitting: *Deleted

## 2020-05-18 NOTE — Telephone Encounter (Signed)
  Follow up Call-  Call back number 05/16/2020  Post procedure Call Back phone  # [phone]  Permission to leave phone message Yes  Some recent data might be hidden     Patient questions:  Do you have a fever, pain , or abdominal swelling? No. Pain Score  0 *  Have you tolerated food without any problems? Yes.    Have you been able to return to your normal activities? Yes.    Do you have any questions about your discharge instructions: Diet   No. Medications  No. Follow up visit  No.  Do you have questions or concerns about your Care? No.  Actions: * If pain score is 4 or above: 1. No action needed, pain <4.Have you developed a fever since your procedure? no  2.   Have you had an respiratory symptoms (SOB or cough) since your procedure? no  3.   Have you tested positive for COVID 19 since your procedure no  4.   Have you had any family members/close contacts diagnosed with the COVID 19 since your procedure?  no   If yes to any of these questions please route to Joylene John, RN and Joella Prince, RN

## 2020-05-22 ENCOUNTER — Encounter: Payer: Self-pay | Admitting: Gastroenterology

## 2020-07-02 ENCOUNTER — Other Ambulatory Visit: Payer: Self-pay

## 2020-07-02 ENCOUNTER — Ambulatory Visit
Admission: RE | Admit: 2020-07-02 | Discharge: 2020-07-02 | Disposition: A | Discharge status: Home / Self Care | Payer: 59 | Source: Ambulatory Visit | Attending: Obstetrics and Gynecology | Admitting: Obstetrics and Gynecology

## 2020-07-02 DIAGNOSIS — Z780 Asymptomatic menopausal state: Secondary | ICD-10-CM | POA: Diagnosis not present

## 2020-07-02 DIAGNOSIS — N951 Menopausal and female climacteric states: Secondary | ICD-10-CM

## 2020-07-02 DIAGNOSIS — M8588 Other specified disorders of bone density and structure, other site: Secondary | ICD-10-CM | POA: Diagnosis not present

## 2020-09-07 ENCOUNTER — Encounter: Payer: Self-pay | Admitting: Family Medicine

## 2020-09-07 ENCOUNTER — Ambulatory Visit: Payer: 59 | Admitting: Family Medicine

## 2020-09-07 ENCOUNTER — Other Ambulatory Visit: Payer: Self-pay

## 2020-09-07 VITALS — BP 126/74 | HR 64 | Temp 97.2°F | Ht 64.0 in | Wt 230.0 lb

## 2020-09-07 DIAGNOSIS — F411 Generalized anxiety disorder: Secondary | ICD-10-CM

## 2020-09-07 DIAGNOSIS — L821 Other seborrheic keratosis: Secondary | ICD-10-CM | POA: Diagnosis not present

## 2020-09-07 DIAGNOSIS — Q828 Other specified congenital malformations of skin: Secondary | ICD-10-CM | POA: Diagnosis not present

## 2020-09-07 MED ORDER — CITALOPRAM HYDROBROMIDE 20 MG PO TABS: 20.0000 mg | ORAL_TABLET | Freq: Every day | ORAL | 11 refills | Status: DC

## 2020-09-07 NOTE — Progress Notes (Signed)
Subjective:    Patient ID: Kristi Sims, female    DOB: [birth year], 65 y.o.   MRN: [id number]  HPI   Patient has 3 skin tags on the left side of her neck. These are pedunculated with a stalk. They are all between 1 and 2 mm in size. Those 3 are shown in a triangle formation above on either side of the crease in her neck. She also has a seborrheic keratosis on her right leg just below the popliteal fossa on the medial aspect of the knee. That is shown below.   This lesion is approximately 6 mm in diameter. Past Medical History:  Diagnosis Date  . Allergy    Phreesia 01/01/2020  . Generalized anxiety disorder 02/09/2013  . GERD (gastroesophageal reflux disease)   . Hyperlipidemia    no meds    Past Surgical History:  Procedure Laterality Date  . BREAST SURGERY     both breasts benign  . GANGLION CYST EXCISION Left    wrist   Current Outpatient Medications on File Prior to Visit  Medication Sig Dispense Refill  . citalopram (CELEXA) 20 MG tablet TAKE 1 TABLET BY MOUTH EVERY DAY (Patient not taking: Reported on 07/27/2019) 30 tablet 0  . Multiple Vitamin (MULTIVITAMIN) tablet Take 1 tablet by mouth daily.    Marland Kitchen nystatin-triamcinolone ointment (MYCOLOG) APPLY TO AFFECTED AREA TWICE A DAY FOR 10 DAYS AS NEEDED    . Omega-3 Fatty Acids (OMEGA-3 PO) Take by mouth. Omega XL daily    . OVER THE COUNTER MEDICATION Benefiber Prebiotic- 2 tbsp daily     No current facility-administered medications on file prior to visit.   Allergies  Allergen Reactions  . Robitussin 12 Hour Cough [Dextromethorphan Polistirex Er] Itching   Social History   Socioeconomic History  . Marital status: Single    Spouse name: Not on file  . Number of children: Not on file  . Years of education: Not on file  . Highest education level: Not on file  Occupational History  . Occupation: Factory Work  Tobacco Use  . Smoking status: Former Smoker    Types: Cigarettes    Quit date: 03/12/2013    Years since  quitting: 7.4  . Smokeless tobacco: Never Used  . Tobacco comment: cigarettes here and there  Vaping Use  . Vaping Use: Never used  Substance and Sexual Activity  . Alcohol use: No  . Drug use: No  . Sexual activity: Never    Birth control/protection: Abstinence  Other Topics Concern  . 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    "Tries to live [employer] Active by Choice"   Social Determinants of Health   Financial Resource Strain: Not on file  Food Insecurity: Not on file  Transportation Needs: Not on file  Physical Activity: Not on file  Stress: Not on file  Social Connections: Not on file  Intimate Partner Violence: Not on file     Review of Systems  All other systems reviewed and are negative.      Objective:   Physical Exam Vitals reviewed.  Constitutional:      Appearance: Normal appearance.  Cardiovascular:     Rate and Rhythm: Normal rate and regular rhythm.     Heart sounds: Normal heart sounds.  Pulmonary:     Effort:  Pulmonary effort is normal. No respiratory distress.     Breath sounds: Normal breath sounds. No wheezing, rhonchi or rales.  Neurological:     Mental Status: She is alert.           Assessment & Plan:  Seborrheic keratosis  Generalized anxiety disorder - Plan: citalopram (CELEXA) 20 MG tablet  Accessory skin tags  I treated the 3 skin tags on the left side of the neck with liquid nitrogen cryotherapy for a total of approximately 20 seconds each. These should fall off on their own. The lesion on the medial aspect of the posterior right knee I anesthetized with 0.1% lidocaine with epinephrine. I performed a shave biopsy technique down to the underlying dermis. The lesion was removed in its entirety. Hemostasis was achieved with Drysol. Patient requested a refill on Celexa that she has taken in  the past for anxiety. Therefore I refilled the Celexa as that medication it helped her significantly before.

## 2020-09-29 ENCOUNTER — Other Ambulatory Visit: Payer: Self-pay | Admitting: Family Medicine

## 2020-09-29 DIAGNOSIS — F411 Generalized anxiety disorder: Secondary | ICD-10-CM

## 2021-02-22 ENCOUNTER — Ambulatory Visit (INDEPENDENT_AMBULATORY_CARE_PROVIDER_SITE_OTHER): Payer: 59 | Admitting: Family Medicine

## 2021-02-22 ENCOUNTER — Other Ambulatory Visit: Payer: Self-pay

## 2021-02-22 ENCOUNTER — Encounter: Payer: Self-pay | Admitting: Family Medicine

## 2021-02-22 VITALS — BP 124/70 | HR 58 | Temp 97.9°F | Resp 14 | Ht 64.0 in | Wt 226.0 lb

## 2021-02-22 DIAGNOSIS — E559 Vitamin D deficiency, unspecified: Secondary | ICD-10-CM | POA: Diagnosis not present

## 2021-02-22 DIAGNOSIS — Z0001 Encounter for general adult medical examination with abnormal findings: Secondary | ICD-10-CM

## 2021-02-22 DIAGNOSIS — Z0000 Encounter for general adult medical examination without abnormal findings: Secondary | ICD-10-CM | POA: Diagnosis not present

## 2021-02-22 NOTE — Progress Notes (Signed)
Subjective:    Patient ID: Kristi Sims, female    DOB: 1956/05/10, 65 y.o.   MRN: [id number]  HPI Patient is a very pleasant 65 year old African-American female here today for complete physical exam.  She has made an appointment with her gynecologist in October.  They perform her mammogram annually and also her Pap smear and pelvic exam.  She had a colonoscopy last year which was excellent.  Her bone density test was performed recently that showed osteopenia and she is taking calcium and vitamin D.  She does have a history of vitamin D deficiency.  She is currently taking 2000 units a day of vitamin D but is due to recheck her vitamin D level.  Her most recent immunizations are listed below Immunization History  Administered Date(s) Administered   Influenza-Unspecified 03/28/2014   Tdap 07/14/2009   Her blood pressure today is normal however at home her blood pressure has been ranging between 0000000 and [id number] systolic.  The vast majority of blood pressures are in the 140s. Past Medical History:  Diagnosis Date   Allergy    Phreesia 01/01/2020   Generalized anxiety disorder 02/09/2013   GERD (gastroesophageal reflux disease)    Hyperlipidemia    no meds    Past Surgical History:  Procedure Laterality Date   BREAST SURGERY     both breasts benign   GANGLION CYST EXCISION Left    wrist   Current Outpatient Medications on File Prior to Visit  Medication Sig Dispense Refill   Ascorbic Acid (VITAMIN C) 500 MG CAPS      cholecalciferol (VITAMIN D) 25 MCG (1000 UNIT) tablet      citalopram (CELEXA) 20 MG tablet TAKE 1 TABLET BY MOUTH EVERY DAY 90 tablet 4   docusate sodium (COLACE) 100 MG capsule      Multiple Minerals-Vitamins (CALCIUM-MAGNESIUM-ZINC-D3 PO)      Multiple Vitamin (MULTIVITAMIN) tablet Take 1 tablet by mouth daily.     nystatin-triamcinolone ointment (MYCOLOG) APPLY TO AFFECTED AREA TWICE A DAY FOR 10 DAYS AS NEEDED     Omega-3 Fatty Acids (OMEGA-3 PO) Take by mouth. Omega  XL daily     OVER THE COUNTER MEDICATION Benefiber Prebiotic- 2 tbsp daily     No current facility-administered medications on file prior to visit.   Allergies  Allergen Reactions   Robitussin 12 Hour Cough [Dextromethorphan Polistirex Er] Itching   Social History   Socioeconomic History   Marital status: Single    Spouse name: Not on file   Number of children: Not on file   Years of education: Not on file   Highest education level: Not on file  Occupational History   Occupation: Factory Work  Tobacco Use   Smoking status: Former    Types: Cigarettes    Quit date: 03/12/2013    Years since quitting: 7.9   Smokeless tobacco: Never   Tobacco comments:    cigarettes here and there  Vaping Use   Vaping Use: Never used  Substance and Sexual Activity   Alcohol use: No   Drug use: No   Sexual activity: Never    Birth control/protection: Abstinence  Other Topics Concern   Not on file  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    "Tries to live [employer] Active by Choice"   Social Determinants of Health  Financial Resource Strain: Not on file  Food Insecurity: Not on file  Transportation Needs: Not on file  Physical Activity: Not on file  Stress: Not on file  Social Connections: Not on file  Intimate Partner Violence: Not on file     Review of Systems  All other systems reviewed and are negative.     Objective:   Physical Exam Vitals reviewed.  Constitutional:      Appearance: Normal appearance.  Cardiovascular:     Rate and Rhythm: Normal rate and regular rhythm.     Heart sounds: Normal heart sounds.  Pulmonary:     Effort: Pulmonary effort is normal. No respiratory distress.     Breath sounds: Normal breath sounds. No wheezing, rhonchi or rales.  Neurological:     Mental Status: She is alert.          Assessment & Plan:   Vitamin D deficiency - Plan: VITAMIN D 25 Hydroxy (Vit-D Deficiency, Fractures)  General medical exam - Plan: CBC with Differential/Platelet, COMPLETE METABOLIC PANEL WITH GFR, Lipid panel, VITAMIN D 25 Hydroxy (Vit-D Deficiency, Fractures) Cancer screening is up-to-date.  Patient is due for the shingles vaccine but otherwise is only due for a flu shot this fall.  Blood pressures are too high at home.  I recommended that she bring her cuff in so that we can verify.  If blood pressure is accurate at home I would start hydrochlorothiazide 25 mg daily.  Repeat a vitamin D level today.  Check CBC CMP fasting lipid panel as well.  Goal LDL cholesterol is less than 100.  Strongly encourage low-sodium diet exercise and weight loss.

## 2021-02-27 LAB — COMPLETE METABOLIC PANEL WITH GFR
AG Ratio: 1.9 (calc) (ref 1.0–2.5)
ALT: 18 U/L (ref 6–29)
AST: 20 U/L (ref 10–35)
Albumin: 4.2 g/dL (ref 3.6–5.1)
Alkaline phosphatase (APISO): 69 U/L (ref 37–153)
BUN: 11 mg/dL (ref 7–25)
CO2: 29 mmol/L (ref 20–32)
Calcium: 9.3 mg/dL (ref 8.6–10.4)
Chloride: 103 mmol/L (ref 98–110)
Creat: 0.72 mg/dL (ref 0.50–1.05)
Globulin: 2.2 g/dL (calc) (ref 1.9–3.7)
Glucose, Bld: 84 mg/dL (ref 65–99)
Potassium: 4.4 mmol/L (ref 3.5–5.3)
Sodium: 139 mmol/L (ref 135–146)
Total Bilirubin: 0.5 mg/dL (ref 0.2–1.2)
Total Protein: 6.4 g/dL (ref 6.1–8.1)
eGFR: 93 mL/min/{1.73_m2} (ref >=60)

## 2021-02-27 LAB — LIPID PANEL
Cholesterol: 187 mg/dL (ref <200)
HDL: 63 mg/dL (ref >=50)
LDL Cholesterol (Calc): 112 mg/dL (calc) — ABNORMAL HIGH
Non-HDL Cholesterol (Calc): 124 mg/dL (calc) (ref <130)
Total CHOL/HDL Ratio: 3 (calc) (ref <5.0)
Triglycerides: 42 mg/dL (ref <150)

## 2021-02-27 LAB — CBC WITH DIFFERENTIAL/PLATELET
Absolute Monocytes: 450 cells/uL (ref 200–950)
Basophils Absolute: 51 cells/uL (ref 0–200)
Basophils Relative: 0.9 %
Eosinophils Absolute: 148 cells/uL (ref 15–500)
Eosinophils Relative: 2.6 %
HCT: 40.6 % (ref 35.0–45.0)
Hemoglobin: 13 g/dL (ref 11.7–15.5)
Lymphs Abs: 2696 cells/uL (ref 850–3900)
MCH: 29.5 pg (ref 27.0–33.0)
MCHC: 32 g/dL (ref 32.0–36.0)
MCV: 92.3 fL (ref 80.0–100.0)
MPV: 10.2 fL (ref 7.5–12.5)
Monocytes Relative: 7.9 %
Neutro Abs: 2354 cells/uL (ref 1500–7800)
Neutrophils Relative %: 41.3 %
Platelets: 412 10*3/uL — ABNORMAL HIGH (ref 140–400)
RBC: 4.4 10*6/uL (ref 3.80–5.10)
RDW: 11 % (ref 11.0–15.0)
Total Lymphocyte: 47.3 %
WBC: 5.7 10*3/uL (ref 3.8–10.8)

## 2021-02-27 LAB — VITAMIN D 25 HYDROXY (VIT D DEFICIENCY, FRACTURES): Vit D, 25-Hydroxy: 30 ng/mL (ref 30–100)

## 2021-03-05 ENCOUNTER — Other Ambulatory Visit: Payer: Self-pay

## 2021-03-05 ENCOUNTER — Ambulatory Visit (INDEPENDENT_AMBULATORY_CARE_PROVIDER_SITE_OTHER): Payer: 59 | Admitting: Family Medicine

## 2021-03-05 VITALS — BP 138/76 | HR 78 | Ht 64.0 in | Wt 226.0 lb

## 2021-03-05 DIAGNOSIS — I10 Essential (primary) hypertension: Secondary | ICD-10-CM

## 2021-03-07 ENCOUNTER — Encounter: Payer: Self-pay | Admitting: Family Medicine

## 2021-03-21 DIAGNOSIS — N898 Other specified noninflammatory disorders of vagina: Secondary | ICD-10-CM | POA: Diagnosis not present

## 2021-03-21 DIAGNOSIS — Z01419 Encounter for gynecological examination (general) (routine) without abnormal findings: Secondary | ICD-10-CM | POA: Diagnosis not present

## 2021-03-21 DIAGNOSIS — Z1231 Encounter for screening mammogram for malignant neoplasm of breast: Secondary | ICD-10-CM | POA: Diagnosis not present

## 2021-03-21 DIAGNOSIS — Z6838 Body mass index (BMI) 38.0-38.9, adult: Secondary | ICD-10-CM | POA: Diagnosis not present

## 2021-03-29 DIAGNOSIS — Z0100 Encounter for examination of eyes and vision without abnormal findings: Secondary | ICD-10-CM | POA: Diagnosis not present

## 2021-08-06 ENCOUNTER — Other Ambulatory Visit (HOSPITAL_COMMUNITY): Payer: Self-pay

## 2021-08-06 ENCOUNTER — Other Ambulatory Visit: Payer: Self-pay

## 2021-08-06 ENCOUNTER — Ambulatory Visit: Payer: 59 | Admitting: Family Medicine

## 2021-08-06 ENCOUNTER — Encounter: Payer: Self-pay | Admitting: Family Medicine

## 2021-08-06 DIAGNOSIS — M7061 Trochanteric bursitis, right hip: Secondary | ICD-10-CM | POA: Diagnosis not present

## 2021-08-06 MED ORDER — DICLOFENAC SODIUM 75 MG PO TBEC
75.0000 mg | DELAYED_RELEASE_TABLET | Freq: Two times a day (BID) | ORAL | 0 refills | Status: AC
  Filled 2021-08-06: qty 60, 30d supply, fill #0

## 2021-08-06 MED ORDER — CITALOPRAM HYDROBROMIDE 20 MG PO TABS
20.0000 mg | ORAL_TABLET | Freq: Every day | ORAL | 4 refills | Status: DC
  Filled 2021-08-06: qty 90, 90d supply, fill #0
  Filled 2021-12-16: qty 90, 90d supply, fill #1
  Filled 2022-03-14: qty 90, 90d supply, fill #2
  Filled 2022-06-22: qty 90, 90d supply, fill #3

## 2021-08-06 NOTE — Progress Notes (Signed)
Subjective:    Patient ID: Kristi Sims, female    DOB: [birth year], 66 y.o.   MRN: [id number]  HPI  Patient presents today with pain over the lateral right foot.  She states that is been gradually getting worse over the last year.  She is tender to palpation of the greater trochanter.  She states that it hurts to lay on her side at night.  She has no pain with flexion or extension of her hip.  She has no pain with internal rotation although she does have some mild pain over the greater trochanter with external rotation.  She states that it hurts to stand and walk.  She has no sciatica-like pain.  There is no pain in the knees. Past Medical History:  Diagnosis Date   Allergy    Phreesia 01/01/2020   Generalized anxiety disorder 02/09/2013   GERD (gastroesophageal reflux disease)    Hyperlipidemia    no meds    Past Surgical History:  Procedure Laterality Date   BREAST SURGERY     both breasts benign   GANGLION CYST EXCISION Left    wrist   Current Outpatient Medications on File Prior to Visit  Medication Sig Dispense Refill   Ascorbic Acid (VITAMIN C) 500 MG CAPS      cholecalciferol (VITAMIN D) 25 MCG (1000 UNIT) tablet      docusate sodium (COLACE) 100 MG capsule      Multiple Minerals-Vitamins (CALCIUM-MAGNESIUM-ZINC-D3 PO)      nystatin-triamcinolone ointment (MYCOLOG) APPLY TO AFFECTED AREA TWICE A DAY FOR 10 DAYS AS NEEDED     Omega-3 Fatty Acids (OMEGA-3 PO) Take by mouth. Omega XL daily     OVER THE COUNTER MEDICATION Benefiber Prebiotic- 2 tbsp daily     No current facility-administered medications on file prior to visit.   Allergies  Allergen Reactions   Robitussin 12 Hour Cough [Dextromethorphan Polistirex Er] Itching   Social History   Socioeconomic History   Marital status: Single    Spouse name: Not on file   Number of children: Not on file   Years of education: Not on file   Highest education level: Not on file  Occupational History   Occupation:  Factory Work  Tobacco Use   Smoking status: Former    Types: Cigarettes    Quit date: 03/12/2013    Years since quitting: 8.4   Smokeless tobacco: Never   Tobacco comments:    cigarettes here and there  Vaping Use   Vaping Use: Never used  Substance and Sexual Activity   Alcohol use: No   Drug use: No   Sexual activity: Never    Birth control/protection: Abstinence  Other Topics Concern   Not on file  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    "Tries to live [employer] Active by Choice"   Social Determinants of Health   Financial Resource Strain: Not on file  Food Insecurity: Not on file  Transportation Needs: Not on file  Physical Activity: Not on file  Stress: Not on file  Social Connections: Not on file  Intimate Partner Violence: Not on file     Review of Systems  All other systems reviewed and are negative.     Objective:   Physical Exam Vitals reviewed.  Constitutional:      Appearance: Normal appearance. She  is normal weight.  Cardiovascular:     Rate and Rhythm: Normal rate and regular rhythm.     Heart sounds: Normal heart sounds. No murmur heard.   No friction rub. No gallop.  Pulmonary:     Effort: Pulmonary effort is normal.     Breath sounds: Normal breath sounds.  Musculoskeletal:     Right hip: Tenderness and bony tenderness present. No crepitus. Normal range of motion.       Legs:  Neurological:     Mental Status: She is alert.          Assessment & Plan:   Greater trochanteric bursitis of right hip - Plan: citalopram (CELEXA) 20 MG tablet Recommended trying diclofenac 75 mg twice daily.  If not better, the neck step would either be physical therapy or referral to orthopedics for cortisone injection.  While the patient was here today I refilled her Celexa.

## 2021-12-16 ENCOUNTER — Other Ambulatory Visit (HOSPITAL_COMMUNITY): Payer: Self-pay

## 2021-12-19 ENCOUNTER — Other Ambulatory Visit (HOSPITAL_COMMUNITY): Payer: Self-pay

## 2022-01-06 IMAGING — DX DG ANKLE COMPLETE 3+V*L*
3 series · 3 of 3 positions shown · non-contrast
Comparison: None.

CLINICAL DATA: Pain

EXAM:
LEFT ANKLE COMPLETE - 3+ VIEW

[ankle ap]
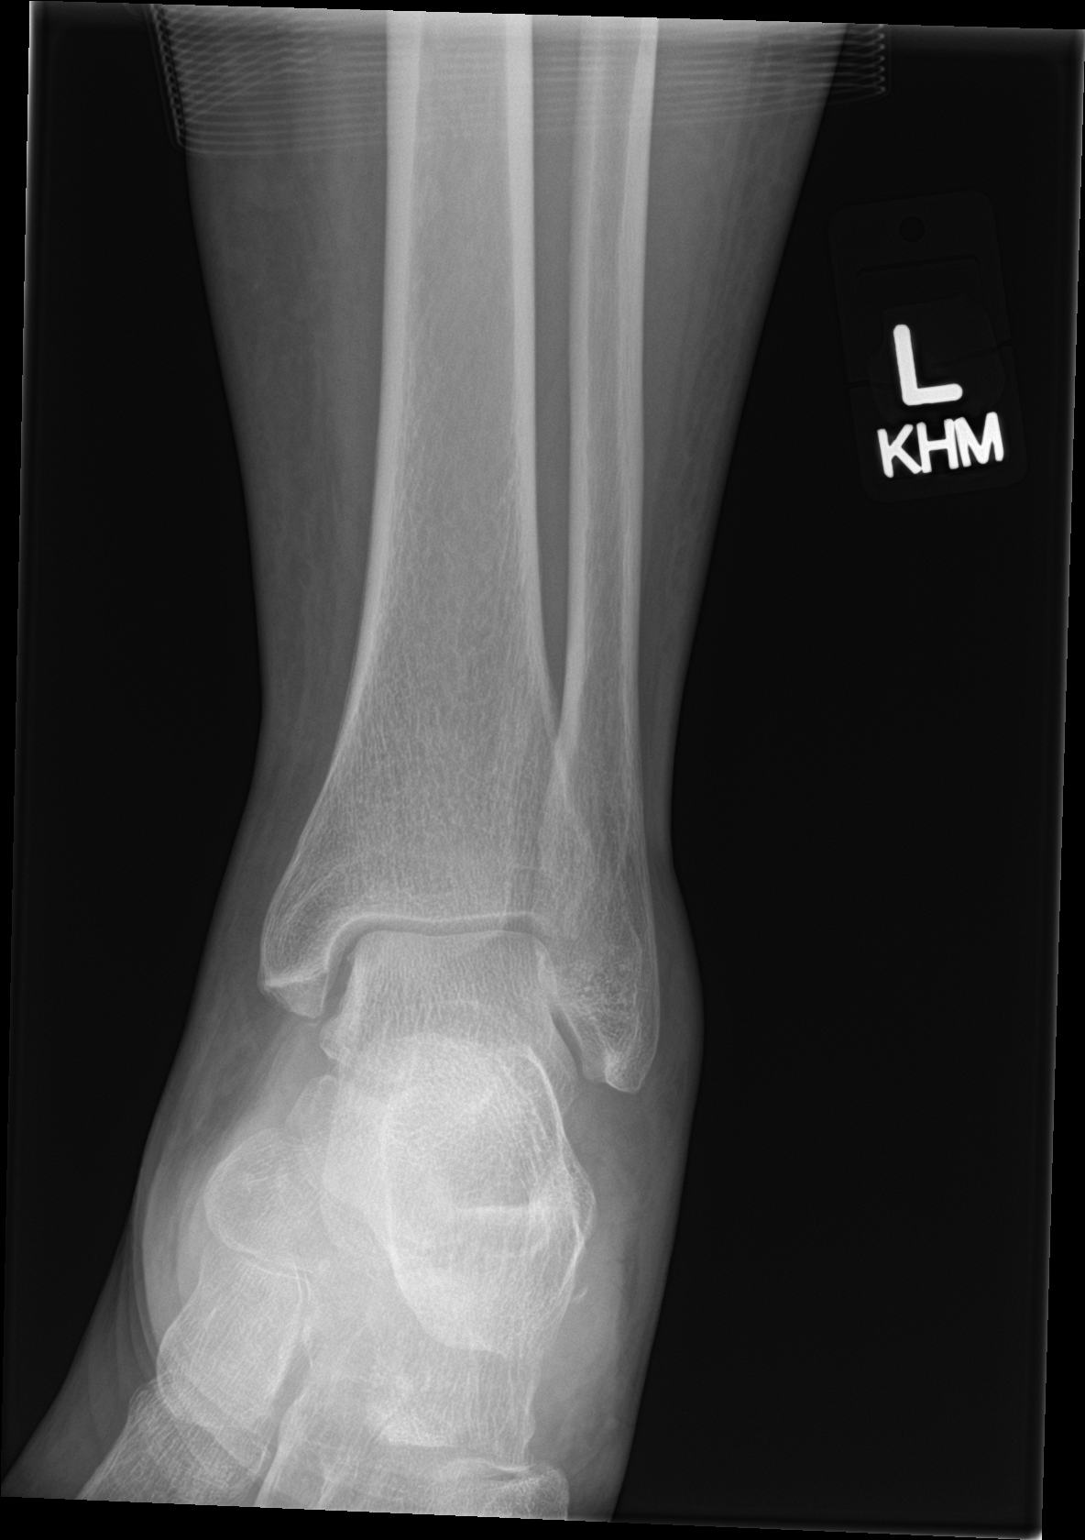

[ankle obl]
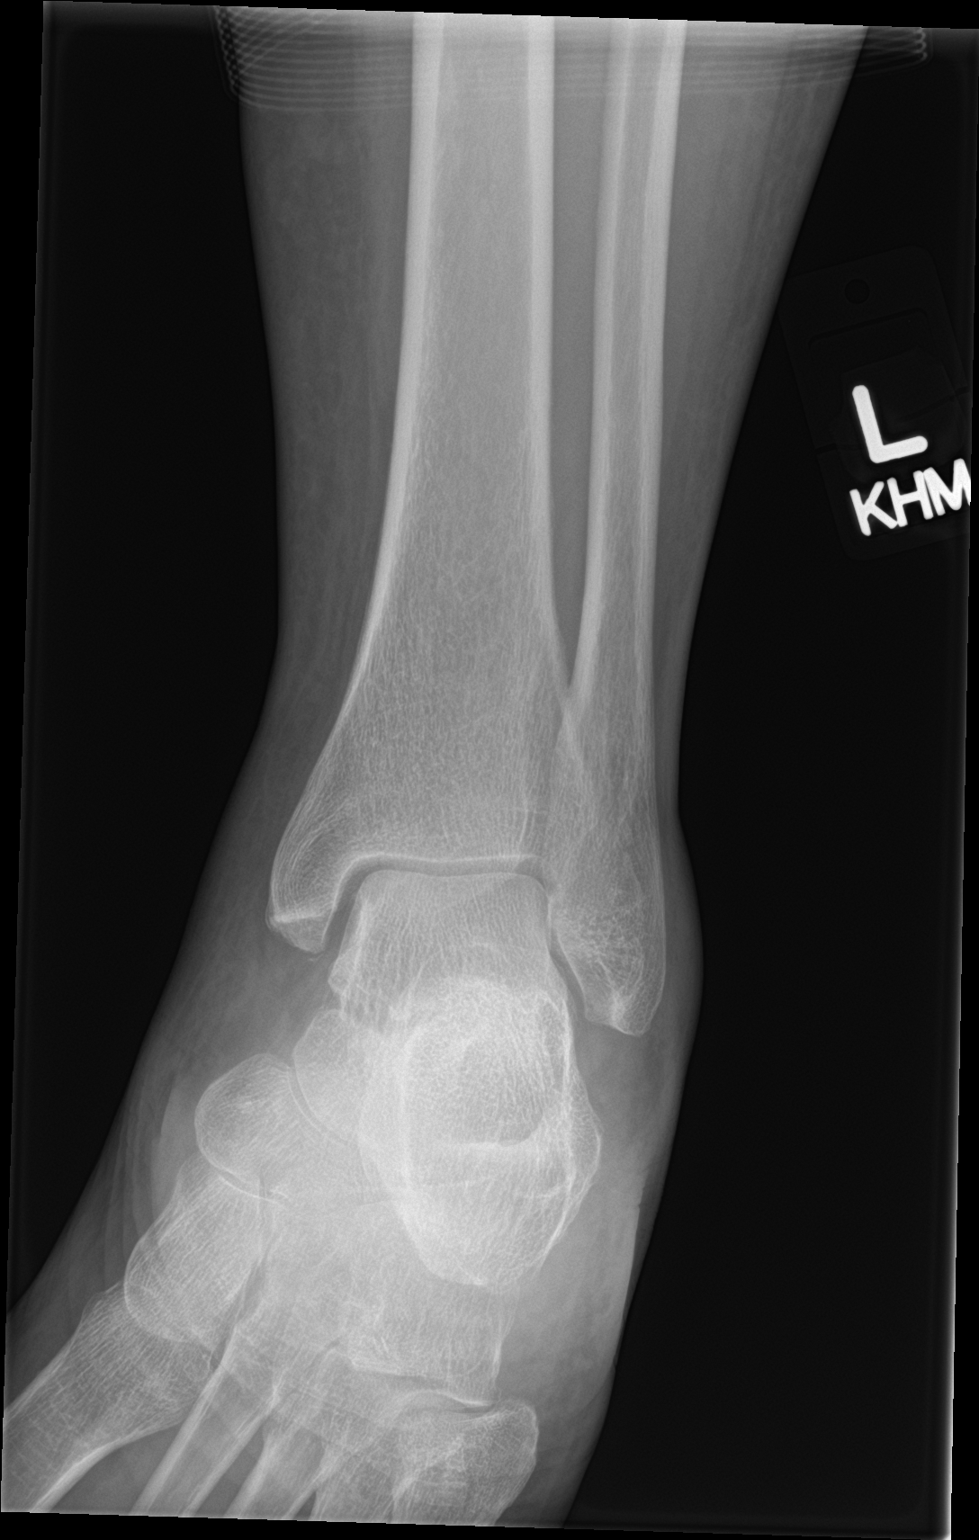

[ankle lat]
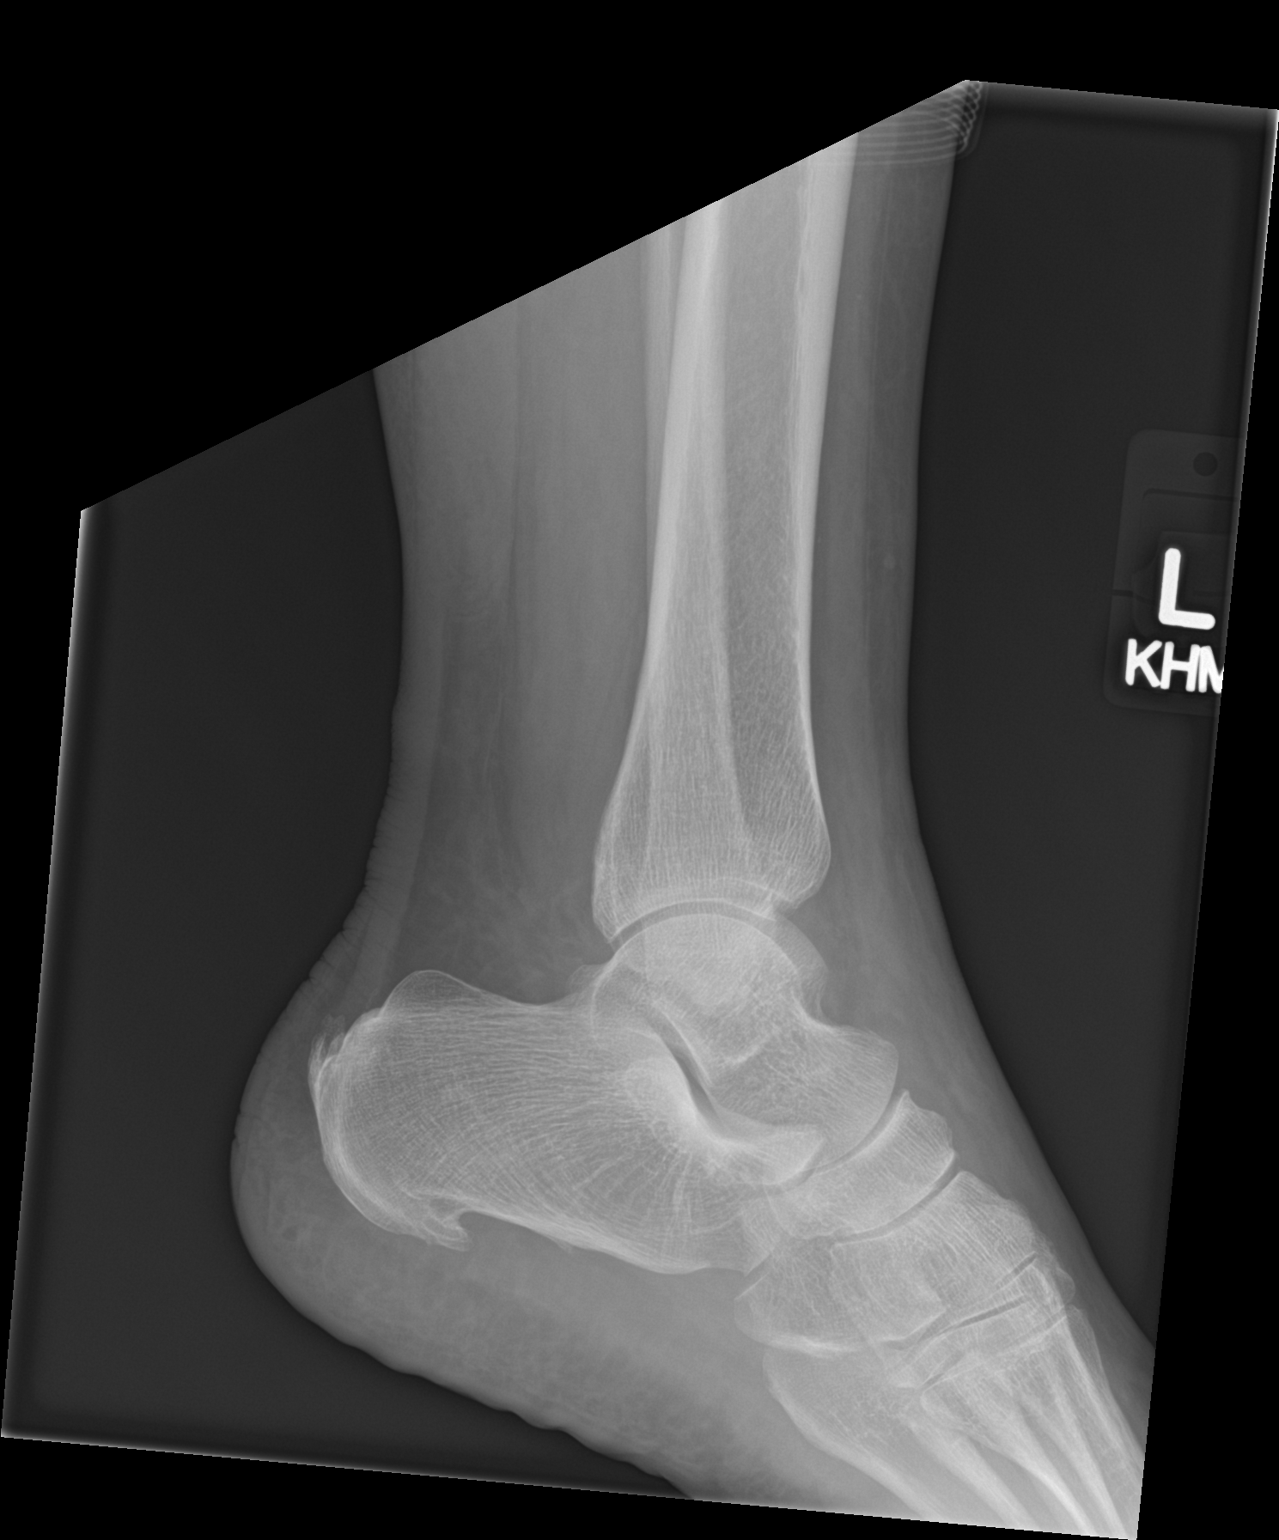

[3 of 3 positions shown; findings below may reference images not displayed]

FINDINGS: There is an acute lateral calcaneal avulsion fracture with
surrounding soft tissue swelling. There is no dislocation. There is
a moderate-sized plantar calcaneal spur. There is an Achilles tendon
enthesophyte.
IMPRESSION: Small avulsion fracture arising from the lateral calcaneus.

## 2022-03-10 ENCOUNTER — Other Ambulatory Visit (HOSPITAL_COMMUNITY): Payer: Self-pay

## 2022-03-10 DIAGNOSIS — L292 Pruritus vulvae: Secondary | ICD-10-CM | POA: Insufficient documentation

## 2022-03-10 MED ORDER — NYSTATIN-TRIAMCINOLONE 100000-0.1 UNIT/GM-% EX OINT
1.0000 | TOPICAL_OINTMENT | Freq: Two times a day (BID) | CUTANEOUS | 0 refills | Status: AC
  Filled 2022-03-10: qty 30, 10d supply, fill #0

## 2022-03-13 ENCOUNTER — Ambulatory Visit (INDEPENDENT_AMBULATORY_CARE_PROVIDER_SITE_OTHER): Payer: 59 | Admitting: Family Medicine

## 2022-03-13 VITALS — BP 128/76 | HR 50 | Temp 97.6°F | Ht 65.5 in | Wt 220.0 lb

## 2022-03-13 DIAGNOSIS — Z0000 Encounter for general adult medical examination without abnormal findings: Secondary | ICD-10-CM | POA: Diagnosis not present

## 2022-03-13 DIAGNOSIS — Z23 Encounter for immunization: Secondary | ICD-10-CM | POA: Diagnosis not present

## 2022-03-13 DIAGNOSIS — Z13220 Encounter for screening for lipoid disorders: Secondary | ICD-10-CM

## 2022-03-13 NOTE — Addendum Note (Signed)
Addended by: Colman Cater on: 03/13/2022 11:02 AM   Modules accepted: Orders

## 2022-03-13 NOTE — Patient Instructions (Signed)
Pt given prevnar 20 on the L-upper arm. Pt tol well, pt left ambulatory w/no c/o per pt.

## 2022-03-13 NOTE — Progress Notes (Signed)
Subjective:    Patient ID: Kristi Sims, female    DOB: Dec 13, 1955, 66 y.o.   MRN: [id number]  HPI  Patient is a 66 year old African-American female who is here today for complete physical exam.  She sees a gynecologist and has an appointment in September for her Pap smear and her mammogram.  These are up-to-date.  Her last colonoscopy was 2021.  She is not due again until 2031.  Patient had a bone density test 2 years ago.  This showed osteopenia.  She has not consistently been taking her calcium or vitamin D.  Otherwise she is doing well with no concerns.  She occasionally takes diclofenac for bursitis in her hip. Past Medical History:  Diagnosis Date   Allergy    Phreesia 01/01/2020   Generalized anxiety disorder 02/09/2013   GERD (gastroesophageal reflux disease)    Hyperlipidemia    no meds    Past Surgical History:  Procedure Laterality Date   BREAST SURGERY     both breasts benign   GANGLION CYST EXCISION Left    wrist   Current Outpatient Medications on File Prior to Visit  Medication Sig Dispense Refill   Ascorbic Acid (VITAMIN C) 500 MG CAPS      cholecalciferol (VITAMIN D) 25 MCG (1000 UNIT) tablet      citalopram (CELEXA) 20 MG tablet Take 1 tablet (20 mg total) by mouth daily. 90 tablet 4   diclofenac (VOLTAREN) 75 MG EC tablet Take 1 tablet (75 mg total) by mouth 2 (two) times daily. 60 tablet 0   docusate sodium (COLACE) 100 MG capsule      Multiple Minerals-Vitamins (CALCIUM-MAGNESIUM-ZINC-D3 PO)      nystatin-triamcinolone ointment (MYCOLOG) APPLY TO AFFECTED AREA TWICE A DAY FOR 10 DAYS AS NEEDED     nystatin-triamcinolone ointment (MYCOLOG) Apply 1 application topically 2 (two) times daily to the affected area for 10 days as  needed 30 g 0   Omega-3 Fatty Acids (OMEGA-3 PO) Take by mouth. Omega XL daily     OVER THE COUNTER MEDICATION Benefiber Prebiotic- 2 tbsp daily     No current facility-administered medications on file prior to visit.   Allergies   Allergen Reactions   Robitussin 12 Hour Cough [Dextromethorphan Polistirex Er] Itching   Social History   Socioeconomic History   Marital status: Single    Spouse name: Not on file   Number of children: Not on file   Years of education: Not on file   Highest education level: Not on file  Occupational History   Occupation: Factory Work  Tobacco Use   Smoking status: Former    Types: Cigarettes    Quit date: 03/12/2013    Years since quitting: 9.0   Smokeless tobacco: Never   Tobacco comments:    cigarettes here and there  Vaping Use   Vaping Use: Never used  Substance and Sexual Activity   Alcohol use: No   Drug use: No   Sexual activity: Never    Birth control/protection: Abstinence  Other Topics Concern   Not on file  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    "Tries to live [employer] Active by Choice"   Social Determinants of Health   Financial Resource Strain: Not on file  Food Insecurity: Not on file  Transportation Needs: Not on file  Physical  Activity: Not on file  Stress: Not on file  Social Connections: Not on file  Intimate Partner Violence: Not on file   Family History  Problem Relation Age of Onset   Cancer Mother        cervical s/p hyst   Cervical cancer Mother    Hypertension Father    Diabetes Brother    Colon cancer Neg Hx    Colon polyps Neg Hx    Esophageal cancer Neg Hx    Rectal cancer Neg Hx    Stomach cancer Neg Hx     Review of Systems  All other systems reviewed and are negative.      Objective:   Physical Exam Vitals reviewed.  Constitutional:      General: She is not in acute distress.    Appearance: She is obese. She is not ill-appearing, toxic-appearing or diaphoretic.  HENT:     Head: Normocephalic and atraumatic.     Right Ear: Tympanic membrane and ear canal normal.     Left Ear:  Tympanic membrane and ear canal normal.     Nose: Nose normal. No congestion or rhinorrhea.     Mouth/Throat:     Mouth: Mucous membranes are moist.     Pharynx: Oropharynx is clear. No oropharyngeal exudate or posterior oropharyngeal erythema.  Eyes:     General: No scleral icterus.       Right eye: No discharge.        Left eye: No discharge.     Extraocular Movements: Extraocular movements intact.     Conjunctiva/sclera: Conjunctivae normal.     Pupils: Pupils are equal, round, and reactive to light.  Neck:     Vascular: No carotid bruit.  Cardiovascular:     Rate and Rhythm: Normal rate and regular rhythm.     Pulses: Normal pulses.     Heart sounds: Normal heart sounds. No murmur heard.    No friction rub. No gallop.  Pulmonary:     Effort: Pulmonary effort is normal. No respiratory distress.     Breath sounds: Normal breath sounds. No stridor. No wheezing, rhonchi or rales.  Abdominal:     General: Abdomen is flat. Bowel sounds are normal. There is no distension.     Palpations: Abdomen is soft.     Tenderness: There is no abdominal tenderness. There is no guarding or rebound.  Musculoskeletal:        General: No swelling, tenderness, deformity or signs of injury. Normal range of motion.     Cervical back: Normal range of motion and neck supple.     Right lower leg: No edema.     Left lower leg: No edema.  Lymphadenopathy:     Cervical: No cervical adenopathy.  Skin:    General: Skin is warm.     Coloration: Skin is not jaundiced or pale.     Findings: No bruising, erythema, lesion or rash.  Neurological:     General: No focal deficit present.     Mental Status: She is alert and oriented to person, place, and time. Mental status is at baseline.     Cranial Nerves: No cranial nerve deficit.     Sensory: No sensory deficit.     Motor: No weakness.     Coordination: Coordination normal.     Gait: Gait normal.     Deep Tendon Reflexes: Reflexes normal.  Psychiatric:         Mood and Affect: Mood normal.  Behavior: Behavior normal.        Thought Content: Thought content normal.        Judgment: Judgment normal.           Assessment & Plan:  Screening cholesterol level - Plan: CBC with Differential/Platelet, Lipid panel, COMPLETE METABOLIC PANEL WITH GFR  General medical exam Physical exam today is normal.  Mammogram and Pap smear have been scheduled through her gynecologist.  Colonoscopy is up-to-date.  I would repeat bone density test next year.  I encouraged her to take calcium 1200 mg a day and vitamin D 1000 units a day.  I reviewed her shot records.  I recommended Prevnar 20.  I recommended a flu shot this fall and a COVID booster when they become available.  She is also due for the shingles vaccine at her earliest convenience.  She is due for a tetanus shot however I dissuaded her from getting this just due to cost reasons today.  She can get this if she decides to in the future.  I believe the other shots are more important.  I will screen a CBC, CMP and a lipid panel

## 2022-03-14 ENCOUNTER — Other Ambulatory Visit: Payer: Self-pay

## 2022-03-14 ENCOUNTER — Other Ambulatory Visit (HOSPITAL_COMMUNITY): Payer: Self-pay

## 2022-03-14 DIAGNOSIS — Z13220 Encounter for screening for lipoid disorders: Secondary | ICD-10-CM

## 2022-03-14 LAB — COMPLETE METABOLIC PANEL WITH GFR
AG Ratio: 1.8 (calc) (ref 1.0–2.5)
ALT: 30 U/L — ABNORMAL HIGH (ref 6–29)
AST: 32 U/L (ref 10–35)
Albumin: 4.6 g/dL (ref 3.6–5.1)
Alkaline phosphatase (APISO): 81 U/L (ref 37–153)
BUN: 10 mg/dL (ref 7–25)
CO2: 29 mmol/L (ref 20–32)
Calcium: 10 mg/dL (ref 8.6–10.4)
Chloride: 103 mmol/L (ref 98–110)
Creat: 0.84 mg/dL (ref 0.50–1.05)
Globulin: 2.6 g/dL (calc) (ref 1.9–3.7)
Glucose, Bld: 76 mg/dL (ref 65–99)
Potassium: 4.4 mmol/L (ref 3.5–5.3)
Sodium: 140 mmol/L (ref 135–146)
Total Bilirubin: 0.6 mg/dL (ref 0.2–1.2)
Total Protein: 7.2 g/dL (ref 6.1–8.1)
eGFR: 77 mL/min/{1.73_m2} (ref >=60)

## 2022-03-14 LAB — CBC WITH DIFFERENTIAL/PLATELET
Absolute Monocytes: 392 cells/uL (ref 200–950)
Basophils Absolute: 62 cells/uL (ref 0–200)
Basophils Relative: 1.1 %
Eosinophils Absolute: 90 cells/uL (ref 15–500)
Eosinophils Relative: 1.6 %
HCT: 40.9 % (ref 35.0–45.0)
Hemoglobin: 13.5 g/dL (ref 11.7–15.5)
Lymphs Abs: 2682 cells/uL (ref 850–3900)
MCH: 29.5 pg (ref 27.0–33.0)
MCHC: 33 g/dL (ref 32.0–36.0)
MCV: 89.5 fL (ref 80.0–100.0)
MPV: 10.2 fL (ref 7.5–12.5)
Monocytes Relative: 7 %
Neutro Abs: 2374 cells/uL (ref 1500–7800)
Neutrophils Relative %: 42.4 %
Platelets: 395 10*3/uL (ref 140–400)
RBC: 4.57 10*6/uL (ref 3.80–5.10)
RDW: 10.9 % — ABNORMAL LOW (ref 11.0–15.0)
Total Lymphocyte: 47.9 %
WBC: 5.6 10*3/uL (ref 3.8–10.8)

## 2022-03-14 LAB — LIPID PANEL
Cholesterol: 234 mg/dL — ABNORMAL HIGH (ref <200)
HDL: 79 mg/dL (ref >=50)
LDL Cholesterol (Calc): 141 mg/dL (calc) — ABNORMAL HIGH
Non-HDL Cholesterol (Calc): 155 mg/dL (calc) — ABNORMAL HIGH (ref <130)
Total CHOL/HDL Ratio: 3 (calc) (ref <5.0)
Triglycerides: 47 mg/dL (ref <150)

## 2022-03-14 MED ORDER — ROSUVASTATIN CALCIUM 10 MG PO TABS
10.0000 mg | ORAL_TABLET | Freq: Every day | ORAL | 3 refills | Status: DC
  Filled 2022-03-14: qty 90, 90d supply, fill #0

## 2022-03-27 DIAGNOSIS — Z6838 Body mass index (BMI) 38.0-38.9, adult: Secondary | ICD-10-CM | POA: Diagnosis not present

## 2022-03-27 DIAGNOSIS — Z0142 Encounter for cervical smear to confirm findings of recent normal smear following initial abnormal smear: Secondary | ICD-10-CM | POA: Diagnosis not present

## 2022-03-27 DIAGNOSIS — Z01419 Encounter for gynecological examination (general) (routine) without abnormal findings: Secondary | ICD-10-CM | POA: Diagnosis not present

## 2022-03-27 DIAGNOSIS — Z01411 Encounter for gynecological examination (general) (routine) with abnormal findings: Secondary | ICD-10-CM | POA: Diagnosis not present

## 2022-03-27 DIAGNOSIS — Z124 Encounter for screening for malignant neoplasm of cervix: Secondary | ICD-10-CM | POA: Diagnosis not present

## 2022-03-27 DIAGNOSIS — Z1231 Encounter for screening mammogram for malignant neoplasm of breast: Secondary | ICD-10-CM | POA: Diagnosis not present

## 2022-06-29 ENCOUNTER — Encounter: Payer: Self-pay | Admitting: Family Medicine

## 2022-06-30 ENCOUNTER — Telehealth: Payer: Self-pay

## 2022-06-30 NOTE — Telephone Encounter (Signed)
MY CHART MESSAGE FROM PATIENT:  Dr. Dennard Schaumann I stopped taking the Rosuvastatin because I've been having stomaching cramping. Please advise what I should do about this matter.

## 2022-07-04 ENCOUNTER — Other Ambulatory Visit (HOSPITAL_COMMUNITY): Payer: Self-pay

## 2022-07-08 ENCOUNTER — Other Ambulatory Visit (HOSPITAL_COMMUNITY): Payer: Self-pay

## 2022-07-24 ENCOUNTER — Ambulatory Visit (INDEPENDENT_AMBULATORY_CARE_PROVIDER_SITE_OTHER): Payer: Commercial Managed Care - PPO | Admitting: Family Medicine

## 2022-07-24 ENCOUNTER — Encounter: Payer: Self-pay | Admitting: Family Medicine

## 2022-07-24 VITALS — BP 120/76 | HR 50 | Ht 65.5 in | Wt 221.6 lb

## 2022-07-24 DIAGNOSIS — E7800 Pure hypercholesterolemia, unspecified: Secondary | ICD-10-CM

## 2022-07-24 NOTE — Progress Notes (Signed)
Subjective:    Patient ID: Kristi Sims, female    DOB: Dec 02, 1955, 67 y.o.   MRN: [id number]  HPI  Patient was recently started on Crestor for hyperlipidemia.  However she developed cramping epigastric pain.  She stopped the medication and the pain went away.  She denies any melena or hematochezia or nausea or vomiting or diarrhea or constipation.  She denies any fevers or chills or unintentional weight loss. Past Medical History:  Diagnosis Date   Allergy    Phreesia 01/01/2020   Generalized anxiety disorder 02/09/2013   GERD (gastroesophageal reflux disease)    Hyperlipidemia    no meds    Past Surgical History:  Procedure Laterality Date   BREAST SURGERY     both breasts benign   GANGLION CYST EXCISION Left    wrist   Current Outpatient Medications on File Prior to Visit  Medication Sig Dispense Refill   Ascorbic Acid (VITAMIN C) 500 MG CAPS      cholecalciferol (VITAMIN D) 25 MCG (1000 UNIT) tablet      citalopram (CELEXA) 20 MG tablet Take 1 tablet (20 mg total) by mouth daily. 90 tablet 4   diclofenac (VOLTAREN) 75 MG EC tablet Take 1 tablet (75 mg total) by mouth 2 (two) times daily. 60 tablet 0   docusate sodium (COLACE) 100 MG capsule      Multiple Minerals-Vitamins (CALCIUM-MAGNESIUM-ZINC-D3 PO)      nystatin-triamcinolone ointment (MYCOLOG) Apply 1 application topically 2 (two) times daily to the affected area for 10 days as  needed 30 g 0   Omega-3 Fatty Acids (OMEGA-3 PO) Take by mouth. Omega XL daily     OVER THE COUNTER MEDICATION Benefiber Prebiotic- 2 tbsp daily     No current facility-administered medications on file prior to visit.   Allergies  Allergen Reactions   Robitussin 12 Hour Cough [Dextromethorphan Polistirex Er] Itching   Social History   Socioeconomic History   Marital status: Single    Spouse name: Not on file   Number of children: Not on file   Years of education: Not on file   Highest education level: Not on file  Occupational  History   Occupation: Factory Work  Tobacco Use   Smoking status: Former    Types: Cigarettes    Quit date: 03/12/2013    Years since quitting: 9.3   Smokeless tobacco: Never   Tobacco comments:    cigarettes here and there  Vaping Use   Vaping Use: Never used  Substance and Sexual Activity   Alcohol use: No   Drug use: No   Sexual activity: Never    Birth control/protection: Abstinence  Other Topics Concern   Not on file  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    "Tries to live [employer] Active by Choice"   Social Determinants of Health   Financial Resource Strain: Not on file  Food Insecurity: Not on file  Transportation Needs: Not on file  Physical Activity: Not on file  Stress: Not on file  Social Connections: Not on file  Intimate Partner Violence: Not on file   Family History  Problem Relation Age of Onset   Cancer Mother        cervical s/p hyst   Cervical cancer Mother    Hypertension Father    Diabetes Brother  Colon cancer Neg Hx    Colon polyps Neg Hx    Esophageal cancer Neg Hx    Rectal cancer Neg Hx    Stomach cancer Neg Hx     Review of Systems  All other systems reviewed and are negative.      Objective:   Physical Exam Vitals reviewed.  Constitutional:      General: She is not in acute distress.    Appearance: She is obese. She is not ill-appearing, toxic-appearing or diaphoretic.  HENT:     Head: Normocephalic and atraumatic.     Right Ear: Tympanic membrane and ear canal normal.     Left Ear: Tympanic membrane and ear canal normal.     Nose: Nose normal. No congestion or rhinorrhea.     Mouth/Throat:     Mouth: Mucous membranes are moist.     Pharynx: Oropharynx is clear. No oropharyngeal exudate or posterior oropharyngeal erythema.  Eyes:     General: No scleral icterus.       Right eye: No  discharge.        Left eye: No discharge.     Extraocular Movements: Extraocular movements intact.     Conjunctiva/sclera: Conjunctivae normal.     Pupils: Pupils are equal, round, and reactive to light.  Neck:     Vascular: No carotid bruit.  Cardiovascular:     Rate and Rhythm: Normal rate and regular rhythm.     Pulses: Normal pulses.     Heart sounds: Normal heart sounds. No murmur heard.    No friction rub. No gallop.  Pulmonary:     Effort: Pulmonary effort is normal. No respiratory distress.     Breath sounds: Normal breath sounds. No stridor. No wheezing, rhonchi or rales.  Abdominal:     General: Abdomen is flat. Bowel sounds are normal. There is no distension.     Palpations: Abdomen is soft.     Tenderness: There is no abdominal tenderness. There is no guarding or rebound.  Musculoskeletal:        General: No swelling, tenderness, deformity or signs of injury. Normal range of motion.     Cervical back: Normal range of motion and neck supple.     Right lower leg: No edema.     Left lower leg: No edema.  Lymphadenopathy:     Cervical: No cervical adenopathy.  Skin:    General: Skin is warm.     Coloration: Skin is not jaundiced or pale.     Findings: No bruising, erythema, lesion or rash.  Neurological:     General: No focal deficit present.     Mental Status: She is alert and oriented to person, place, and time. Mental status is at baseline.     Cranial Nerves: No cranial nerve deficit.     Sensory: No sensory deficit.     Motor: No weakness.     Coordination: Coordination normal.     Gait: Gait normal.     Deep Tendon Reflexes: Reflexes normal.  Psychiatric:        Mood and Affect: Mood normal.        Behavior: Behavior normal.        Thought Content: Thought content normal.        Judgment: Judgment normal.           Assessment & Plan:  Pure hypercholesterolemia - Plan: CBC with Differential/Platelet, Lipid panel, COMPLETE METABOLIC PANEL WITH  GFR Patient is no longer on the  Crestor.  I will check a CBC and a CMP to monitor liver function test and for any leukocytosis.  I will also get a cholesterol panel.  If her cholesterol is good, we will see if she can manage this with diet and exercise rather than resuming the medication.  If labs are abnormal, consider imaging of the abdomen and pelvis to determine the cause of her abdominal pain

## 2022-07-25 LAB — COMPLETE METABOLIC PANEL WITH GFR
AG Ratio: 1.6 (calc) (ref 1.0–2.5)
ALT: 25 U/L (ref 6–29)
AST: 25 U/L (ref 10–35)
Albumin: 4.1 g/dL (ref 3.6–5.1)
Alkaline phosphatase (APISO): 67 U/L (ref 37–153)
BUN: 9 mg/dL (ref 7–25)
CO2: 26 mmol/L (ref 20–32)
Calcium: 9.4 mg/dL (ref 8.6–10.4)
Chloride: 106 mmol/L (ref 98–110)
Creat: 0.74 mg/dL (ref 0.50–1.05)
Globulin: 2.6 g/dL (calc) (ref 1.9–3.7)
Glucose, Bld: 88 mg/dL (ref 65–99)
Potassium: 3.8 mmol/L (ref 3.5–5.3)
Sodium: 140 mmol/L (ref 135–146)
Total Bilirubin: 0.5 mg/dL (ref 0.2–1.2)
Total Protein: 6.7 g/dL (ref 6.1–8.1)
eGFR: 90 mL/min/{1.73_m2} (ref >=60)

## 2022-07-25 LAB — CBC WITH DIFFERENTIAL/PLATELET
Absolute Monocytes: 367 cells/uL (ref 200–950)
Basophils Absolute: 11 cells/uL (ref 0–200)
Basophils Relative: 0.4 %
Eosinophils Absolute: 42 cells/uL (ref 15–500)
Eosinophils Relative: 1.5 %
HCT: 41.8 % (ref 35.0–45.0)
Hemoglobin: 14 g/dL (ref 11.7–15.5)
Lymphs Abs: 1498 cells/uL (ref 850–3900)
MCH: 30.2 pg (ref 27.0–33.0)
MCHC: 33.5 g/dL (ref 32.0–36.0)
MCV: 90.3 fL (ref 80.0–100.0)
MPV: 9.9 fL (ref 7.5–12.5)
Monocytes Relative: 13.1 %
Neutro Abs: 882 cells/uL — ABNORMAL LOW (ref 1500–7800)
Neutrophils Relative %: 31.5 %
Platelets: 365 10*3/uL (ref 140–400)
RBC: 4.63 10*6/uL (ref 3.80–5.10)
RDW: 10.9 % — ABNORMAL LOW (ref 11.0–15.0)
Total Lymphocyte: 53.5 %
WBC: 2.8 10*3/uL — ABNORMAL LOW (ref 3.8–10.8)

## 2022-07-25 LAB — LIPID PANEL
Cholesterol: 174 mg/dL (ref <200)
HDL: 60 mg/dL (ref >=50)
LDL Cholesterol (Calc): 100 mg/dL (calc) — ABNORMAL HIGH
Non-HDL Cholesterol (Calc): 114 mg/dL (calc) (ref <130)
Total CHOL/HDL Ratio: 2.9 (calc) (ref <5.0)
Triglycerides: 56 mg/dL (ref <150)

## 2022-07-28 ENCOUNTER — Other Ambulatory Visit (HOSPITAL_COMMUNITY): Payer: Self-pay

## 2022-08-05 ENCOUNTER — Other Ambulatory Visit: Payer: Self-pay | Admitting: Family Medicine

## 2022-08-05 DIAGNOSIS — Z1231 Encounter for screening mammogram for malignant neoplasm of breast: Secondary | ICD-10-CM

## 2022-08-21 ENCOUNTER — Other Ambulatory Visit: Payer: Self-pay

## 2022-08-21 ENCOUNTER — Other Ambulatory Visit: Payer: Self-pay | Admitting: Family Medicine

## 2022-08-21 ENCOUNTER — Other Ambulatory Visit (HOSPITAL_COMMUNITY): Payer: Self-pay

## 2022-08-21 DIAGNOSIS — M7061 Trochanteric bursitis, right hip: Secondary | ICD-10-CM

## 2022-08-21 MED ORDER — CITALOPRAM HYDROBROMIDE 20 MG PO TABS
20.0000 mg | ORAL_TABLET | Freq: Every day | ORAL | 0 refills | Status: DC
  Filled 2022-08-21: qty 30, 30d supply, fill #0

## 2022-09-12 ENCOUNTER — Ambulatory Visit: Payer: 59 | Admitting: Family Medicine

## 2022-09-15 ENCOUNTER — Encounter: Payer: Self-pay | Admitting: Family Medicine

## 2022-10-20 ENCOUNTER — Other Ambulatory Visit (HOSPITAL_COMMUNITY): Payer: Self-pay

## 2022-10-20 ENCOUNTER — Ambulatory Visit: Payer: Commercial Managed Care - PPO | Admitting: Family Medicine

## 2022-10-20 DIAGNOSIS — F411 Generalized anxiety disorder: Secondary | ICD-10-CM

## 2022-10-20 MED ORDER — CITALOPRAM HYDROBROMIDE 20 MG PO TABS
20.0000 mg | ORAL_TABLET | Freq: Every day | ORAL | 3 refills | Status: DC
  Filled 2022-10-20: qty 90, 90d supply, fill #0
  Filled 2023-03-21: qty 90, 90d supply, fill #1
  Filled 2023-08-17: qty 90, 90d supply, fill #2

## 2022-10-20 MED ORDER — CLONAZEPAM 0.5 MG PO TABS
0.5000 mg | ORAL_TABLET | Freq: Three times a day (TID) | ORAL | 2 refills | Status: DC | PRN
  Filled 2022-10-20: qty 30, 10d supply, fill #0
  Filled 2023-01-21: qty 30, 10d supply, fill #1

## 2022-10-20 NOTE — Progress Notes (Signed)
Subjective:    Patient ID: Kristi Sims, female    DOB: 12-Apr-1956, 67 y.o.   MRN: [id number]  HPI 1/24 Patient was recently started on Crestor for hyperlipidemia.  However she developed cramping epigastric pain.  She stopped the medication and the pain went away.  She denies any melena or hematochezia or nausea or vomiting or diarrhea or constipation.  She denies any fevers or chills or unintentional weight loss.  At that time, my plan was: Patient is no longer on the Crestor.  I will check a CBC and a CMP to monitor liver function test and for any leukocytosis.  I will also get a cholesterol panel.  If her cholesterol is good, we will see if she can manage this with diet and exercise rather than resuming the medication.  If labs are abnormal, consider imaging of the abdomen and pelvis to determine the cause of her abdominal pain  10/20/22 Patient presents today requesting a refill on her citalopram.  She uses this for anxiety.  She states that the medication typically works well and helps to prevent depression and control her anxiety however she is under more stress recently.  Her mother is on hospice.  She is caring for her at night and working during the days.  At times she feels overwhelmed.  She feels anxious and panic.  Is difficult for her to care for her mother when she feels like this.  That time she has taken Klonopin in the past to help alleviate anxiety temporarily but she is requesting a refill on this. Past Medical History:  Diagnosis Date   Allergy    Phreesia 01/01/2020   Generalized anxiety disorder 02/09/2013   GERD (gastroesophageal reflux disease)    Hyperlipidemia    no meds    Past Surgical History:  Procedure Laterality Date   BREAST SURGERY     both breasts benign   GANGLION CYST EXCISION Left    wrist   Current Outpatient Medications on File Prior to Visit  Medication Sig Dispense Refill   Ascorbic Acid (VITAMIN C) 500 MG CAPS      cholecalciferol (VITAMIN D)  25 MCG (1000 UNIT) tablet      citalopram (CELEXA) 20 MG tablet Take 1 tablet (20 mg total) by mouth daily. 30 tablet 0   diclofenac (VOLTAREN) 75 MG EC tablet Take 1 tablet (75 mg total) by mouth 2 (two) times daily. 60 tablet 0   docusate sodium (COLACE) 100 MG capsule      Multiple Minerals-Vitamins (CALCIUM-MAGNESIUM-ZINC-D3 PO)      nystatin-triamcinolone ointment (MYCOLOG) Apply 1 application topically 2 (two) times daily to the affected area for 10 days as  needed 30 g 0   Omega-3 Fatty Acids (OMEGA-3 PO) Take by mouth. Omega XL daily     OVER THE COUNTER MEDICATION Benefiber Prebiotic- 2 tbsp daily     No current facility-administered medications on file prior to visit.   Allergies  Allergen Reactions   Robitussin 12 Hour Cough [Dextromethorphan Polistirex Er] Itching   Social History   Socioeconomic History   Marital status: Single    Spouse name: Not on file   Number of children: Not on file   Years of education: Not on file   Highest education level: Not on file  Occupational History   Occupation: Factory Work  Tobacco Use   Smoking status: Former    Types: Cigarettes    Quit date: 03/12/2013    Years since quitting: 9.6  Smokeless tobacco: Never   Tobacco comments:    cigarettes here and there  Vaping Use   Vaping Use: Never used  Substance and Sexual Activity   Alcohol use: No   Drug use: No   Sexual activity: Never    Birth control/protection: Abstinence  Other Topics Concern   Not on file  Social History Narrative   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    "Tries to live [employer] Active by Choice"   Social Determinants of Health   Financial Resource Strain: Not on file  Food Insecurity: Not on file  Transportation Needs: Not on file  Physical Activity: Not on file  Stress: Not on file  Social Connections: Not on file  Intimate Partner  Violence: Not on file   Family History  Problem Relation Age of Onset   Cancer Mother        cervical s/p hyst   Cervical cancer Mother    Hypertension Father    Diabetes Brother    Colon cancer Neg Hx    Colon polyps Neg Hx    Esophageal cancer Neg Hx    Rectal cancer Neg Hx    Stomach cancer Neg Hx     Review of Systems  All other systems reviewed and are negative.      Objective:   Physical Exam Vitals reviewed.  Constitutional:      General: She is not in acute distress.    Appearance: She is obese. She is not ill-appearing, toxic-appearing or diaphoretic.  HENT:     Head: Normocephalic and atraumatic.     Right Ear: Tympanic membrane and ear canal normal.     Left Ear: Tympanic membrane and ear canal normal.     Nose: Nose normal. No congestion or rhinorrhea.     Mouth/Throat:     Mouth: Mucous membranes are moist.     Pharynx: Oropharynx is clear. No oropharyngeal exudate or posterior oropharyngeal erythema.  Eyes:     General: No scleral icterus.       Right eye: No discharge.        Left eye: No discharge.     Extraocular Movements: Extraocular movements intact.     Conjunctiva/sclera: Conjunctivae normal.     Pupils: Pupils are equal, round, and reactive to light.  Neck:     Vascular: No carotid bruit.  Cardiovascular:     Rate and Rhythm: Normal rate and regular rhythm.     Pulses: Normal pulses.     Heart sounds: Normal heart sounds. No murmur heard.    No friction rub. No gallop.  Pulmonary:     Effort: Pulmonary effort is normal. No respiratory distress.     Breath sounds: Normal breath sounds. No stridor. No wheezing, rhonchi or rales.  Abdominal:     General: Abdomen is flat. Bowel sounds are normal. There is no distension.     Palpations: Abdomen is soft.     Tenderness: There is no abdominal tenderness. There is no guarding or rebound.  Musculoskeletal:        General: No swelling, tenderness, deformity or signs of injury. Normal range of  motion.     Cervical back: Normal range of motion and neck supple.     Right lower leg: No edema.     Left lower leg: No edema.  Lymphadenopathy:     Cervical:  No cervical adenopathy.  Skin:    General: Skin is warm.     Coloration: Skin is not jaundiced or pale.     Findings: No bruising, erythema, lesion or rash.  Neurological:     General: No focal deficit present.     Mental Status: She is alert and oriented to person, place, and time. Mental status is at baseline.     Cranial Nerves: No cranial nerve deficit.     Sensory: No sensory deficit.     Motor: No weakness.     Coordination: Coordination normal.     Gait: Gait normal.     Deep Tendon Reflexes: Reflexes normal.  Psychiatric:        Mood and Affect: Mood normal.        Behavior: Behavior normal.        Thought Content: Thought content normal.        Judgment: Judgment normal.           Assessment & Plan:  GAD (generalized anxiety disorder) - Plan: citalopram (CELEXA) 20 MG tablet Continue Celexa.  She will take 20 mg a day.  However when asked Klonopin 0.5 mg every 8 hours as needed.  I cautioned her to use the medication sparingly to avoid habituation and dependency

## 2023-01-21 ENCOUNTER — Other Ambulatory Visit (HOSPITAL_COMMUNITY): Payer: Self-pay

## 2023-03-19 ENCOUNTER — Ambulatory Visit (INDEPENDENT_AMBULATORY_CARE_PROVIDER_SITE_OTHER): Payer: Commercial Managed Care - PPO | Admitting: Family Medicine

## 2023-03-19 ENCOUNTER — Encounter: Payer: Self-pay | Admitting: Family Medicine

## 2023-03-19 VITALS — BP 128/72 | HR 52 | Temp 98.2°F | Ht 65.5 in | Wt 229.8 lb

## 2023-03-19 DIAGNOSIS — E7800 Pure hypercholesterolemia, unspecified: Secondary | ICD-10-CM

## 2023-03-19 DIAGNOSIS — Z23 Encounter for immunization: Secondary | ICD-10-CM | POA: Diagnosis not present

## 2023-03-19 DIAGNOSIS — Z0001 Encounter for general adult medical examination with abnormal findings: Secondary | ICD-10-CM | POA: Diagnosis not present

## 2023-03-19 DIAGNOSIS — F411 Generalized anxiety disorder: Secondary | ICD-10-CM | POA: Diagnosis not present

## 2023-03-19 DIAGNOSIS — Z0000 Encounter for general adult medical examination without abnormal findings: Secondary | ICD-10-CM

## 2023-03-19 NOTE — Progress Notes (Signed)
Subjective:    Patient ID: Kristi Sims, female    DOB: 16-Oct-1955, 67 y.o.   MRN: [id number]  HPI  Patient is a 67 year old African-American female who is here today for complete physical exam.  She sees her gynecologist who performs her Pap smear and pelvic exam.  She prefers to get her mammogram through her gynecologist.  She is due for that now.  She is also 3 years out from her last bone density test.  In 2021 she was found to have osteopenia.  She is due to repeat this.  She prefers to get this through her gynecologist.  Her colonoscopy was last performed in 2021.  She is due for this again in 2031.  Her blood pressure today is excellent.  She has been exercising and has recently joined the gym.  She is due for Pneumovax 23 and Shingrix Past Medical History:  Diagnosis Date   Allergy    Phreesia 01/01/2020   Generalized anxiety disorder 02/09/2013   GERD (gastroesophageal reflux disease)    Hyperlipidemia    no meds    Past Surgical History:  Procedure Laterality Date   BREAST SURGERY     both breasts benign   GANGLION CYST EXCISION Left    wrist   Current Outpatient Medications on File Prior to Visit  Medication Sig Dispense Refill   cholecalciferol (VITAMIN D) 25 MCG (1000 UNIT) tablet      citalopram (CELEXA) 20 MG tablet Take 1 tablet (20 mg total) by mouth daily. 90 tablet 3   clonazePAM (KLONOPIN) 0.5 MG tablet Take 1 tablet (0.5 mg total) by mouth 3 (three) times daily as needed for anxiety. 30 tablet 2   diclofenac (VOLTAREN) 75 MG EC tablet Take 1 tablet (75 mg total) by mouth 2 (two) times daily. 60 tablet 0   docusate sodium (COLACE) 100 MG capsule      Multiple Minerals-Vitamins (CALCIUM-MAGNESIUM-ZINC-D3 PO)      nystatin-triamcinolone ointment (MYCOLOG) Apply 1 application topically 2 (two) times daily to the affected area for 10 days as  needed 30 g 0   OVER THE COUNTER MEDICATION Benefiber Prebiotic- 2 tbsp daily     Ascorbic Acid (VITAMIN C) 500 MG CAPS   (Patient not taking: Reported on 10/20/2022)     No current facility-administered medications on file prior to visit.   Allergies  Allergen Reactions   Robitussin 12 Hour Cough [Dextromethorphan Polistirex Er] Itching   Social History   Socioeconomic History   Marital status: Single    Spouse name: Not on file   Number of children: Not on file   Years of education: Not on file   Highest education level: Not on file  Occupational History   Occupation: Factory Work  Tobacco Use   Smoking status: Former    Current packs/day: 0.00    Types: Cigarettes    Quit date: 03/12/2013    Years since quitting: 10.0   Smokeless tobacco: Never   Tobacco comments:    cigarettes here and there  Vaping Use   Vaping status: Never Used  Substance and Sexual Activity   Alcohol use: No   Drug use: No   Sexual activity: Never    Birth control/protection: Abstinence  Other Topics Concern   Not on file  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    "Tries to  live [place] [employer]-Not Sexually Active by Choice"   Social Determinants of Health   Financial Resource Strain: Not on file  Food Insecurity: Not on file  Transportation Needs: Not on file  Physical Activity: Not on file  Stress: Not on file  Social Connections: Not on file  Intimate Partner Violence: Not on file   Family History  Problem Relation Age of Onset   Cancer Mother        cervical s/p hyst   Cervical cancer Mother    Hypertension Father    Diabetes Brother    Colon cancer Neg Hx    Colon polyps Neg Hx    Esophageal cancer Neg Hx    Rectal cancer Neg Hx    Stomach cancer Neg Hx     Review of Systems  All other systems reviewed and are negative.      Objective:   Physical Exam Vitals reviewed.  Constitutional:      General: She is not in acute distress.    Appearance: She is obese. She is not  ill-appearing, toxic-appearing or diaphoretic.  HENT:     Head: Normocephalic and atraumatic.     Right Ear: Tympanic membrane and ear canal normal.     Left Ear: Tympanic membrane and ear canal normal.     Nose: Nose normal. No congestion or rhinorrhea.     Mouth/Throat:     Mouth: Mucous membranes are moist.     Pharynx: Oropharynx is clear. No oropharyngeal exudate or posterior oropharyngeal erythema.  Eyes:     General: No scleral icterus.       Right eye: No discharge.        Left eye: No discharge.     Extraocular Movements: Extraocular movements intact.     Conjunctiva/sclera: Conjunctivae normal.     Pupils: Pupils are equal, round, and reactive to light.  Neck:     Vascular: No carotid bruit.  Cardiovascular:     Rate and Rhythm: Normal rate and regular rhythm.     Pulses: Normal pulses.     Heart sounds: Normal heart sounds. No murmur heard.    No friction rub. No gallop.  Pulmonary:     Effort: Pulmonary effort is normal. No respiratory distress.     Breath sounds: Normal breath sounds. No stridor. No wheezing, rhonchi or rales.  Abdominal:     General: Abdomen is flat. Bowel sounds are normal. There is no distension.     Palpations: Abdomen is soft.     Tenderness: There is no abdominal tenderness. There is no guarding or rebound.  Musculoskeletal:        General: No swelling, tenderness, deformity or signs of injury. Normal range of motion.     Cervical back: Normal range of motion and neck supple.     Right lower leg: No edema.     Left lower leg: No edema.  Lymphadenopathy:     Cervical: No cervical adenopathy.  Skin:    General: Skin is warm.     Coloration: Skin is not jaundiced or pale.     Findings: No bruising, erythema, lesion or rash.  Neurological:     General: No focal deficit present.     Mental Status: She is alert and oriented to person, place, and time. Mental status is at baseline.     Cranial Nerves: No cranial nerve deficit.     Sensory: No  sensory deficit.     Motor: No weakness.  Coordination: Coordination normal.     Gait: Gait normal.     Deep Tendon Reflexes: Reflexes normal.  Psychiatric:        Mood and Affect: Mood normal.        Behavior: Behavior normal.        Thought Content: Thought content normal.        Judgment: Judgment normal.           Assessment & Plan:  Pure hypercholesterolemia - Plan: CBC with Differential/Platelet, COMPLETE METABOLIC PANEL WITH GFR, Lipid panel  General medical exam  GAD (generalized anxiety disorder) Patient is due for a mammogram as well as a bone density test.  She prefers to get this through her gynecologist so I will defer this to gynecology.  She is due for Pneumovax 23 which she received today.  She is also due for Shingrix but she declines this at the present time.  She states that she may return for this shot.  She is also due for seasonal flu shot.  Check CBC CMP and a lipid panel.  Blood pressure is well.  Patient states that her anxiety is well-controlled as long as she takes citalopram.  She finds that she uses Klonopin very infrequently for breakthrough anxiety.  She denies any falls or memory loss.

## 2023-03-20 LAB — COMPLETE METABOLIC PANEL WITH GFR
AG Ratio: 1.7 (calc) (ref 1.0–2.5)
ALT: 17 U/L (ref 6–29)
AST: 20 U/L (ref 10–35)
Albumin: 4.2 g/dL (ref 3.6–5.1)
Alkaline phosphatase (APISO): 84 U/L (ref 37–153)
BUN: 11 mg/dL (ref 7–25)
CO2: 29 mmol/L (ref 20–32)
Calcium: 10 mg/dL (ref 8.6–10.4)
Chloride: 102 mmol/L (ref 98–110)
Creat: 0.82 mg/dL (ref 0.50–1.05)
Globulin: 2.5 g/dL (ref 1.9–3.7)
Glucose, Bld: 90 mg/dL (ref 65–99)
Potassium: 4.5 mmol/L (ref 3.5–5.3)
Sodium: 139 mmol/L (ref 135–146)
Total Bilirubin: 0.7 mg/dL (ref 0.2–1.2)
Total Protein: 6.7 g/dL (ref 6.1–8.1)
eGFR: 79 mL/min/{1.73_m2} (ref >=60)

## 2023-03-20 LAB — CBC WITH DIFFERENTIAL/PLATELET
Absolute Monocytes: 354 {cells}/uL (ref 200–950)
Basophils Absolute: 62 {cells}/uL (ref 0–200)
Basophils Relative: 1.2 %
Eosinophils Absolute: 140 {cells}/uL (ref 15–500)
Eosinophils Relative: 2.7 %
HCT: 40.1 % (ref 35.0–45.0)
Hemoglobin: 13.2 g/dL (ref 11.7–15.5)
Lymphs Abs: 2491 {cells}/uL (ref 850–3900)
MCH: 29.6 pg (ref 27.0–33.0)
MCHC: 32.9 g/dL (ref 32.0–36.0)
MCV: 89.9 fL (ref 80.0–100.0)
MPV: 10.2 fL (ref 7.5–12.5)
Monocytes Relative: 6.8 %
Neutro Abs: 2153 {cells}/uL (ref 1500–7800)
Neutrophils Relative %: 41.4 %
Platelets: 406 10*3/uL — ABNORMAL HIGH (ref 140–400)
RBC: 4.46 10*6/uL (ref 3.80–5.10)
RDW: 11 % (ref 11.0–15.0)
Total Lymphocyte: 47.9 %
WBC: 5.2 10*3/uL (ref 3.8–10.8)

## 2023-03-20 LAB — LIPID PANEL
Cholesterol: 216 mg/dL — ABNORMAL HIGH (ref <200)
HDL: 72 mg/dL (ref >=50)
LDL Cholesterol (Calc): 130 mg/dL — ABNORMAL HIGH
Non-HDL Cholesterol (Calc): 144 mg/dL — ABNORMAL HIGH (ref <130)
Total CHOL/HDL Ratio: 3 (calc) (ref <5.0)
Triglycerides: 51 mg/dL (ref <150)

## 2023-03-27 ENCOUNTER — Ambulatory Visit (INDEPENDENT_AMBULATORY_CARE_PROVIDER_SITE_OTHER): Payer: Commercial Managed Care - PPO

## 2023-03-27 DIAGNOSIS — Z23 Encounter for immunization: Secondary | ICD-10-CM | POA: Diagnosis not present

## 2023-04-27 ENCOUNTER — Telehealth: Payer: Self-pay

## 2023-04-27 NOTE — Telephone Encounter (Signed)
Pt called in asking if nurse would please email a document that pt received her flu shot at pcp's office. Pt would like for the lot number, expiration date to be included on the document. Please email this info to [email or web address].  Cb#: [phone]

## 2023-04-28 NOTE — Telephone Encounter (Signed)
Pt called back to check on status of letter needed for flu shot. Pt stated that she can come into office to pick this letter up if letter not already emailed to her email address [email or web address]). Pt stated that she will going out of town and will need to have this letter for employer [employer] before 05/14/23. Please advise.  Cb#: [id number]

## 2023-05-06 ENCOUNTER — Other Ambulatory Visit: Payer: Self-pay

## 2023-06-19 ENCOUNTER — Other Ambulatory Visit: Payer: Self-pay | Admitting: Family Medicine

## 2023-06-22 NOTE — Telephone Encounter (Signed)
Requested medications are due for refill today.  yes  Requested medications are on the active medications list.  yes  Last refill. 10/20/2022 #30 2 rf  Future visit scheduled.   no  Notes to clinic.  Refill not delegated.    Requested Prescriptions  Pending Prescriptions Disp Refills   clonazePAM (KLONOPIN) 0.5 MG tablet 30 tablet 2    Sig: Take 1 tablet (0.5 mg total) by mouth 3 (three) times daily as needed for anxiety.     Not Delegated - Psychiatry: Anxiolytics/Hypnotics 2 Failed - 06/19/2023  3:07 PM      Failed - This refill cannot be delegated      Failed - Urine Drug Screen completed in last 360 days      Failed - Valid encounter within last 6 months    Recent Outpatient Visits           1 year ago Greater trochanteric bursitis of right hip   [facility] Donita Brooks, MD   2 years ago Essential hypertension   [facility] Tanya Nones, Priscille Heidelberg, MD   2 years ago Vitamin D deficiency   [facility] Pickard, Priscille Heidelberg, MD   2 years ago Seborrheic keratosis   [facility] Tanya Nones, Priscille Heidelberg, MD   3 years ago Screening, lipid   [facility] Elmore Guise, [place]              Passed - Patient is not pregnant

## 2023-06-30 ENCOUNTER — Other Ambulatory Visit (HOSPITAL_COMMUNITY): Payer: Self-pay

## 2023-08-14 ENCOUNTER — Other Ambulatory Visit (HOSPITAL_COMMUNITY): Payer: Self-pay

## 2023-08-14 ENCOUNTER — Other Ambulatory Visit: Payer: Self-pay | Admitting: Family Medicine

## 2023-08-14 MED ORDER — CLONAZEPAM 0.5 MG PO TABS
0.5000 mg | ORAL_TABLET | Freq: Three times a day (TID) | ORAL | 2 refills | Status: DC | PRN
Start: 2023-08-14 — End: 2024-02-16
  Filled 2023-08-14: qty 30, 10d supply, fill #0

## 2023-08-14 NOTE — Telephone Encounter (Signed)
Last Fill: 10/20/22  Last OV: 03/19/23 Next OV: None Scheduled  Routing to provider for review/authorization.

## 2023-09-25 ENCOUNTER — Ambulatory Visit: Payer: Commercial Managed Care - PPO

## 2023-10-08 ENCOUNTER — Other Ambulatory Visit (HOSPITAL_COMMUNITY): Payer: Self-pay

## 2023-10-08 DIAGNOSIS — Z01411 Encounter for gynecological examination (general) (routine) with abnormal findings: Secondary | ICD-10-CM | POA: Diagnosis not present

## 2023-10-08 DIAGNOSIS — Z1331 Encounter for screening for depression: Secondary | ICD-10-CM | POA: Diagnosis not present

## 2023-10-08 DIAGNOSIS — Z01419 Encounter for gynecological examination (general) (routine) without abnormal findings: Secondary | ICD-10-CM | POA: Diagnosis not present

## 2023-10-08 DIAGNOSIS — L293 Anogenital pruritus, unspecified: Secondary | ICD-10-CM | POA: Diagnosis not present

## 2023-10-08 DIAGNOSIS — Z1231 Encounter for screening mammogram for malignant neoplasm of breast: Secondary | ICD-10-CM | POA: Diagnosis not present

## 2023-10-08 DIAGNOSIS — Z124 Encounter for screening for malignant neoplasm of cervix: Secondary | ICD-10-CM | POA: Diagnosis not present

## 2023-10-08 MED ORDER — IMVEXXY STARTER PACK 4 MCG VA INST
VAGINAL_INSERT | VAGINAL | 3 refills | Status: AC
Start: 2023-10-08 — End: 2024-01-29
  Filled 2023-10-08 – 2023-10-12 (×2): qty 18, 28d supply, fill #0

## 2023-10-12 ENCOUNTER — Other Ambulatory Visit: Payer: Self-pay

## 2023-10-13 ENCOUNTER — Other Ambulatory Visit (HOSPITAL_COMMUNITY): Payer: Self-pay

## 2023-10-13 MED ORDER — ESTRADIOL 10 MCG VA TABS
ORAL_TABLET | VAGINAL | 4 refills | Status: AC
  Filled 2023-10-13: qty 30, 70d supply, fill #0

## 2023-10-14 ENCOUNTER — Other Ambulatory Visit (HOSPITAL_COMMUNITY): Payer: Self-pay

## 2023-10-16 ENCOUNTER — Ambulatory Visit

## 2023-10-23 ENCOUNTER — Ambulatory Visit

## 2023-10-23 DIAGNOSIS — Z23 Encounter for immunization: Secondary | ICD-10-CM | POA: Diagnosis not present

## 2023-10-23 NOTE — Progress Notes (Signed)
 Patient is in office today for a nurse visit for Immunization. Patient Injection was given in the  Left deltoid. Patient tolerated injection well.

## 2023-12-13 ENCOUNTER — Other Ambulatory Visit (HOSPITAL_COMMUNITY): Payer: Self-pay

## 2023-12-14 ENCOUNTER — Other Ambulatory Visit (HOSPITAL_COMMUNITY): Payer: Self-pay

## 2023-12-14 ENCOUNTER — Encounter (HOSPITAL_COMMUNITY): Payer: Self-pay

## 2024-01-10 ENCOUNTER — Other Ambulatory Visit: Payer: Self-pay | Admitting: Family Medicine

## 2024-01-10 DIAGNOSIS — F411 Generalized anxiety disorder: Secondary | ICD-10-CM

## 2024-01-11 ENCOUNTER — Encounter (HOSPITAL_COMMUNITY): Payer: Self-pay

## 2024-01-11 ENCOUNTER — Other Ambulatory Visit (HOSPITAL_COMMUNITY): Payer: Self-pay

## 2024-01-12 DIAGNOSIS — N952 Postmenopausal atrophic vaginitis: Secondary | ICD-10-CM | POA: Insufficient documentation

## 2024-01-18 ENCOUNTER — Other Ambulatory Visit: Payer: Self-pay | Admitting: Family Medicine

## 2024-01-18 ENCOUNTER — Other Ambulatory Visit (HOSPITAL_COMMUNITY): Payer: Self-pay

## 2024-01-18 DIAGNOSIS — B3731 Acute candidiasis of vulva and vagina: Secondary | ICD-10-CM | POA: Diagnosis not present

## 2024-01-18 DIAGNOSIS — F411 Generalized anxiety disorder: Secondary | ICD-10-CM

## 2024-01-18 DIAGNOSIS — L292 Pruritus vulvae: Secondary | ICD-10-CM | POA: Diagnosis not present

## 2024-01-18 MED ORDER — NYSTATIN-TRIAMCINOLONE 100000-0.1 UNIT/GM-% EX OINT
TOPICAL_OINTMENT | CUTANEOUS | 1 refills | Status: AC
  Filled 2024-01-18: qty 60, 10d supply, fill #0

## 2024-01-18 MED ORDER — ESTRADIOL 10 MCG VA TABS
1.0000 | ORAL_TABLET | Freq: Every evening | VAGINAL | 4 refills | Status: DC
  Filled 2024-01-18: qty 16, 21d supply, fill #0

## 2024-01-18 MED ORDER — IMVEXXY STARTER PACK 4 MCG VA INST
1.0000 | VAGINAL_INSERT | Freq: Every day | VAGINAL | 3 refills | Status: AC
Start: 2024-01-18 — End: ?
  Filled 2024-01-18: qty 18, 30d supply, fill #0

## 2024-01-18 MED ORDER — FLUCONAZOLE 150 MG PO TABS
150.0000 mg | ORAL_TABLET | Freq: Every day | ORAL | 0 refills | Status: AC | PRN
  Filled 2024-01-18: qty 3, 7d supply, fill #0

## 2024-01-18 NOTE — Telephone Encounter (Signed)
 Copied from CRM [id number]. Topic: Clinical - Medication Refill >> Jan 18, 2024  1:35 PM Carlatta H wrote: Medication: citalopram  (CELEXA ) 20 MG tablet [[id number]]  Has the patient contacted their pharmacy? Yes (Agent: If no, request that the patient contact the pharmacy for the refill. If patient does not wish to contact the pharmacy document the reason why and proceed with request.) (Agent: If yes, when and what did the pharmacy advise?)Stated denied by provider  This is the patient's preferred pharmacy:  [facility] [address], [address] [place] [address] Phone: [phone] Fax: [phone]  Is this the correct pharmacy for this prescription? Yes If no, delete pharmacy and type the correct one.   Has the prescription been filled recently? No  Is the patient out of the medication? Yes  Has the patient been seen for an appointment in the last year OR does the patient have an upcoming appointment? Yes  Can we respond through MyChart? Yes  Agent: Please be advised that Rx refills may take up to 3 business days. We ask that you follow-up with your pharmacy.

## 2024-01-19 ENCOUNTER — Other Ambulatory Visit (HOSPITAL_COMMUNITY): Payer: Self-pay

## 2024-02-16 ENCOUNTER — Other Ambulatory Visit: Payer: Self-pay | Admitting: Family Medicine

## 2024-02-16 ENCOUNTER — Other Ambulatory Visit: Payer: Self-pay

## 2024-02-16 ENCOUNTER — Other Ambulatory Visit (HOSPITAL_COMMUNITY): Payer: Self-pay

## 2024-02-16 DIAGNOSIS — F411 Generalized anxiety disorder: Secondary | ICD-10-CM

## 2024-02-16 MED ORDER — CITALOPRAM HYDROBROMIDE 20 MG PO TABS
20.0000 mg | ORAL_TABLET | Freq: Every day | ORAL | 1 refills | Status: DC
  Filled 2024-02-16: qty 30, 30d supply, fill #0

## 2024-02-16 MED ORDER — CLONAZEPAM 0.5 MG PO TABS
0.5000 mg | ORAL_TABLET | Freq: Three times a day (TID) | ORAL | 2 refills | Status: AC | PRN
  Filled 2024-02-16: qty 30, 10d supply, fill #0

## 2024-02-17 ENCOUNTER — Other Ambulatory Visit: Payer: Self-pay

## 2024-02-18 ENCOUNTER — Ambulatory Visit: Admitting: Family Medicine

## 2024-02-22 ENCOUNTER — Other Ambulatory Visit: Payer: Self-pay

## 2024-03-23 ENCOUNTER — Other Ambulatory Visit (HOSPITAL_COMMUNITY): Payer: Self-pay

## 2024-03-23 MED ORDER — FLUZONE HIGH-DOSE 0.5 ML IM SUSY
0.5000 mL | PREFILLED_SYRINGE | Freq: Once | INTRAMUSCULAR | 0 refills | Status: AC
Start: 2024-03-23 — End: 2024-03-24
  Filled 2024-03-23: qty 0.5, 1d supply, fill #0

## 2024-04-08 ENCOUNTER — Other Ambulatory Visit (HOSPITAL_COMMUNITY): Payer: Self-pay

## 2024-04-08 ENCOUNTER — Encounter: Payer: Self-pay | Admitting: Family Medicine

## 2024-04-08 ENCOUNTER — Ambulatory Visit: Admitting: Family Medicine

## 2024-04-08 VITALS — BP 120/70 | HR 53 | Temp 98.2°F | Resp 97 | Ht 65.5 in | Wt 232.4 lb

## 2024-04-08 DIAGNOSIS — E7800 Pure hypercholesterolemia, unspecified: Secondary | ICD-10-CM

## 2024-04-08 DIAGNOSIS — Z23 Encounter for immunization: Secondary | ICD-10-CM | POA: Diagnosis not present

## 2024-04-08 DIAGNOSIS — Z0001 Encounter for general adult medical examination with abnormal findings: Secondary | ICD-10-CM | POA: Diagnosis not present

## 2024-04-08 DIAGNOSIS — Z0000 Encounter for general adult medical examination without abnormal findings: Secondary | ICD-10-CM

## 2024-04-08 DIAGNOSIS — F411 Generalized anxiety disorder: Secondary | ICD-10-CM | POA: Diagnosis not present

## 2024-04-08 LAB — CBC WITH DIFFERENTIAL/PLATELET
Absolute Lymphocytes: 2935 {cells}/uL (ref 850–3900)
Absolute Monocytes: 476 {cells}/uL (ref 200–950)
Basophils Absolute: 58 {cells}/uL (ref 0–200)
Basophils Relative: 1 %
Eosinophils Absolute: 162 {cells}/uL (ref 15–500)
Eosinophils Relative: 2.8 %
HCT: 41.1 % (ref 35.0–45.0)
Hemoglobin: 13.2 g/dL (ref 11.7–15.5)
MCH: 29.5 pg (ref 27.0–33.0)
MCHC: 32.1 g/dL (ref 32.0–36.0)
MCV: 91.7 fL (ref 80.0–100.0)
MPV: 10.2 fL (ref 7.5–12.5)
Monocytes Relative: 8.2 %
Neutro Abs: 2169 {cells}/uL (ref 1500–7800)
Neutrophils Relative %: 37.4 %
Platelets: 417 Thousand/uL — ABNORMAL HIGH (ref 140–400)
RBC: 4.48 Million/uL (ref 3.80–5.10)
RDW: 11.4 % (ref 11.0–15.0)
Total Lymphocyte: 50.6 %
WBC: 5.8 Thousand/uL (ref 3.8–10.8)

## 2024-04-08 LAB — COMPREHENSIVE METABOLIC PANEL WITH GFR
AG Ratio: 1.7 (calc) (ref 1.0–2.5)
ALT: 18 U/L (ref 6–29)
AST: 21 U/L (ref 10–35)
Albumin: 4.4 g/dL (ref 3.6–5.1)
Alkaline phosphatase (APISO): 84 U/L (ref 37–153)
BUN: 10 mg/dL (ref 7–25)
CO2: 28 mmol/L (ref 20–32)
Calcium: 10.1 mg/dL (ref 8.6–10.4)
Chloride: 102 mmol/L (ref 98–110)
Creat: 0.76 mg/dL (ref 0.50–1.05)
Globulin: 2.6 g/dL (ref 1.9–3.7)
Glucose, Bld: 74 mg/dL (ref 65–99)
Potassium: 4.2 mmol/L (ref 3.5–5.3)
Sodium: 138 mmol/L (ref 135–146)
Total Bilirubin: 0.6 mg/dL (ref 0.2–1.2)
Total Protein: 7 g/dL (ref 6.1–8.1)
eGFR: 86 mL/min/1.73m2 (ref >=60)

## 2024-04-08 LAB — LIPID PANEL
Cholesterol: 219 mg/dL — ABNORMAL HIGH (ref <200)
HDL: 67 mg/dL (ref >=50)
LDL Cholesterol (Calc): 137 mg/dL — ABNORMAL HIGH
Non-HDL Cholesterol (Calc): 152 mg/dL — ABNORMAL HIGH (ref <130)
Total CHOL/HDL Ratio: 3.3 (calc) (ref <5.0)
Triglycerides: 59 mg/dL (ref <150)

## 2024-04-08 MED ORDER — CITALOPRAM HYDROBROMIDE 20 MG PO TABS
20.0000 mg | ORAL_TABLET | Freq: Every day | ORAL | 3 refills | Status: AC
Start: 2024-04-08 — End: ?
  Filled 2024-04-08: qty 90, 90d supply, fill #0

## 2024-04-08 NOTE — Progress Notes (Signed)
 Subjective:    Patient ID: Kristi Sims, female    DOB: May 28, 1956, 68 y.o.   MRN: [id number]  HPI  Patient is a 68 year old African-American female who is here today for complete physical exam.  She sees her gynecologist who performs her Pap smear and pelvic exam.  She prefers to get her mammogram through her gynecologist.  Her colonoscopy was last performed in 2021.  She is due for this again in 2031.  Her blood pressure today is excellent.  Patient is due for a tetanus shot today.  The remainder of her immunizations are up-to-date.  We discussed COVID and RSV.  She declines these at the present time.  She recently had an episode of intense right upper quadrant abdominal pain associated with nausea and vomiting.  It was self-limited and resolved spontaneously on its own. Past Medical History:  Diagnosis Date   Allergy    [employer] 01/01/2020   Generalized anxiety disorder 02/09/2013   GERD (gastroesophageal reflux disease)    Hyperlipidemia    no meds    Past Surgical History:  Procedure Laterality Date   BREAST SURGERY     both breasts benign   GANGLION CYST EXCISION Left    wrist   Current Outpatient Medications on File Prior to Visit  Medication Sig Dispense Refill   cholecalciferol (VITAMIN D ) 25 MCG (1000 UNIT) tablet      clonazePAM  (KLONOPIN ) 0.5 MG tablet Take 1 tablet (0.5 mg total) by mouth 3 (three) times daily as needed for anxiety. 30 tablet 2   diclofenac  (VOLTAREN ) 75 MG EC tablet Take 1 tablet (75 mg total) by mouth 2 (two) times daily. 60 tablet 0   docusate sodium (COLACE) 100 MG capsule      Estradiol  Starter Pack (IMVEXXY  STARTER PACK) 4 MCG INST Place 1 Insert vaginally daily for 2 weeks, then place 1 insert vaginally twice a week thereafter. 30 each 3   fluconazole  (DIFLUCAN ) 150 MG tablet Take 1 tablet (150 mg total) by mouth daily as needed on Days 1, 4 & 7 3 tablet 0   Multiple Minerals-Vitamins (CALCIUM -MAGNESIUM-ZINC-D3 PO)      nystatin -triamcinolone   ointment (MYCOLOG) Apply 1 application topically 2 (two) times daily to the affected area for 10 days as  needed 30 g 0   nystatin -triamcinolone  ointment (MYCOLOG) Apply a small amount to the affected area topically two times daily for 10 days as needed then may continue using twice a week. 60 g 1   OVER THE COUNTER MEDICATION Benefiber Prebiotic- 2 tbsp daily     Ascorbic Acid (VITAMIN C) 500 MG CAPS  (Patient not taking: Reported on 04/08/2024)     No current facility-administered medications on file prior to visit.   Allergies  Allergen Reactions   Robitussin 12 Hour Cough [Dextromethorphan Polistirex Er] Itching   Cat Dander Rash   Social History   Socioeconomic History   Marital status: Single    Spouse name: Not on file   Number of children: Not on file   Years of education: Not on file   Highest education level: Associate degree: academic program  Occupational History   Occupation: Factory Work  Tobacco Use   Smoking status: Former    Current packs/day: 0.00    Types: Cigarettes    Quit date: 03/12/2013    Years since quitting: 11.0   Smokeless tobacco: Never   Tobacco comments:    cigarettes here and there  Vaping Use   Vaping status: Never Used  Substance and Sexual Activity   Alcohol use: No   Drug use: No   Sexual activity: Never    Birth control/protection: Abstinence  Other Topics Concern   Not on file  Social History Narrative   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    Tries to live [employer] Active by Choice   Social Drivers of Health   Financial Resource Strain: Low Risk  (04/04/2024)   Overall Financial Resource Strain (CARDIA)    Difficulty of Paying Living Expenses: Not hard at all  Food Insecurity: Food Insecurity Present (04/04/2024)   Hunger Vital Sign    Worried About Running Out of Food in the Last Year: Sometimes true    Ran Out of Food  in the Last Year: Sometimes true  Transportation Needs: No Transportation Needs (04/04/2024)   PRAPARE - Administrator, Civil Service (Medical): No    Lack of Transportation (Non-Medical): No  Physical Activity: Insufficiently Active (04/04/2024)   Exercise Vital Sign    Days of Exercise per Week: 3 days    Minutes of Exercise per Session: 30 min  Stress: No Stress Concern Present (04/04/2024)   [employer] of Occupational Health - Occupational Stress Questionnaire    Feeling of Stress: Only a little  Social Connections: Moderately Integrated (04/04/2024)   Social Connection and Isolation Panel    Frequency of Communication with Friends and Family: More than three times a week    Frequency of Social Gatherings with Friends and Family: Twice a week    Attends Religious Services: More than 4 times per year    Active Member of [employer] or Organizations: Yes    Attends Engineer, structural: More than 4 times per year    Marital Status: Never married  Catering manager Violence: Not on file   Family History  Problem Relation Age of Onset   Cancer Mother        cervical s/p hyst   Cervical cancer Mother    Hypertension Father    Diabetes Brother    Colon cancer Neg Hx    Colon polyps Neg Hx    Esophageal cancer Neg Hx    Rectal cancer Neg Hx    Stomach cancer Neg Hx     Review of Systems  All other systems reviewed and are negative.      Objective:   Physical Exam Vitals reviewed.  Constitutional:      General: She is not in acute distress.    Appearance: She is obese. She is not ill-appearing, toxic-appearing or diaphoretic.  HENT:     Head: Normocephalic and atraumatic.     Right Ear: Tympanic membrane and ear canal normal.     Left Ear: Tympanic membrane and ear canal normal.     Nose: Nose normal. No congestion or rhinorrhea.     Mouth/Throat:     Mouth: Mucous membranes are moist.     Pharynx: Oropharynx is clear. No oropharyngeal exudate or  posterior oropharyngeal erythema.  Eyes:     General: No scleral icterus.       Right eye: No discharge.        Left eye: No discharge.     Extraocular Movements: Extraocular movements intact.     Conjunctiva/sclera: Conjunctivae normal.     Pupils: Pupils are equal, round, and reactive to light.  Neck:  Vascular: No carotid bruit.  Cardiovascular:     Rate and Rhythm: Normal rate and regular rhythm.     Pulses: Normal pulses.     Heart sounds: Normal heart sounds. No murmur heard.    No friction rub. No gallop.  Pulmonary:     Effort: Pulmonary effort is normal. No respiratory distress.     Breath sounds: Normal breath sounds. No stridor. No wheezing, rhonchi or rales.  Abdominal:     General: Abdomen is flat. Bowel sounds are normal. There is no distension.     Palpations: Abdomen is soft.     Tenderness: There is no abdominal tenderness. There is no guarding or rebound.  Musculoskeletal:        General: No swelling, tenderness, deformity or signs of injury. Normal range of motion.     Cervical back: Normal range of motion and neck supple.     Right lower leg: No edema.     Left lower leg: No edema.  Lymphadenopathy:     Cervical: No cervical adenopathy.  Skin:    General: Skin is warm.     Coloration: Skin is not jaundiced or pale.     Findings: No bruising, erythema, lesion or rash.  Neurological:     General: No focal deficit present.     Mental Status: She is alert and oriented to person, place, and time. Mental status is at baseline.     Cranial Nerves: No cranial nerve deficit.     Sensory: No sensory deficit.     Motor: No weakness.     Coordination: Coordination normal.     Gait: Gait normal.     Deep Tendon Reflexes: Reflexes normal.  Psychiatric:        Mood and Affect: Mood normal.        Behavior: Behavior normal.        Thought Content: Thought content normal.        Judgment: Judgment normal.           Assessment & Plan:  Pure  hypercholesterolemia - Plan: CBC with Differential/Platelet, Comprehensive metabolic panel with GFR, Lipid panel  GAD (generalized anxiety disorder) - Plan: citalopram  (CELEXA ) 20 MG tablet  General medical exam - Plan: CBC with Differential/Platelet, Comprehensive metabolic panel with GFR, Lipid panel Patient's blood pressure today is outstanding.  Her mammogram and pelvic exam and Pap smear are performed through her gynecologist.  Her bone density test and her colonoscopy are up-to-date.  Recommended tetanus shot.  Recommended RSV and COVID vaccinations through her pharmacy.  Check CBC CMP and lipid panel.  Patient's cholesterol is elevated, she is interested in potentially getting a coronary artery calcium  score, she had a bad reaction to Crestor  in the past and is hesitant to take a statin in the future.  I believe the right upper quadrant abdominal pain was likely a gallstone.  If it occurs again I would recommend an ultrasound to evaluate for the presence of cholelithiasis and if present I would recommend general surgery consultation

## 2024-04-08 NOTE — Addendum Note (Signed)
 Addended by: ANGELENA RONAL BRADLEY K on: 04/08/2024 10:47 AM   Modules accepted: Orders

## 2024-04-11 ENCOUNTER — Ambulatory Visit: Payer: Self-pay | Admitting: Family Medicine

## 2025-04-11 ENCOUNTER — Encounter: Admitting: Family Medicine
